# Patient Record
Sex: Female | Born: 1990 | Race: Black or African American | Hispanic: No | Marital: Single | Smoking: Never smoker
Health system: Southern US, Community
[De-identification: ages and names within clinical notes are randomized; demographics above are authoritative.]

## PROBLEM LIST (undated history)

## (undated) ENCOUNTER — Inpatient Hospital Stay (HOSPITAL_COMMUNITY): Payer: Self-pay

---
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
## 2011-01-13 NOTE — Miscellaneous (Signed)
   Clinical Lists Changes  Problems: Changed problem from ASTHMA, UNSPECIFIED (ICD-493.90) to ASTHMA, INTERMITTENT (ICD-493.90) 

## 2011-01-13 NOTE — Miscellaneous (Signed)
Summary: immunizations   Clinical Lists Changes all immunizations from paper chart entered in [facility]. Theresia Lo RN  September 03, 2010 3:38 PM

## 2011-07-15 ENCOUNTER — Emergency Department (HOSPITAL_COMMUNITY)
Admission: EM | Admit: 2011-07-15 | Discharge: 2011-07-16 | Disposition: A | Discharge status: Home / Self Care | Payer: 59 | Attending: Emergency Medicine | Admitting: Emergency Medicine

## 2011-07-15 DIAGNOSIS — S61209A Unspecified open wound of unspecified finger without damage to nail, initial encounter: Secondary | ICD-10-CM | POA: Insufficient documentation

## 2011-07-15 DIAGNOSIS — Y9289 Other specified places as the place of occurrence of the external cause: Secondary | ICD-10-CM | POA: Insufficient documentation

## 2011-07-15 DIAGNOSIS — T148 Other injury of unspecified body region: Secondary | ICD-10-CM | POA: Insufficient documentation

## 2011-07-15 DIAGNOSIS — X58XXXA Exposure to other specified factors, initial encounter: Secondary | ICD-10-CM | POA: Insufficient documentation

## 2011-07-15 DIAGNOSIS — W57XXXA Bitten or stung by nonvenomous insect and other nonvenomous arthropods, initial encounter: Secondary | ICD-10-CM | POA: Insufficient documentation

## 2012-04-06 ENCOUNTER — Ambulatory Visit (INDEPENDENT_AMBULATORY_CARE_PROVIDER_SITE_OTHER): Payer: 59 | Admitting: Family Medicine

## 2012-04-06 ENCOUNTER — Encounter: Payer: Self-pay | Admitting: Family Medicine

## 2012-04-06 VITALS — BP 98/62 | HR 79 | Temp 98.3°F | Ht 61.25 in | Wt 131.0 lb

## 2012-04-06 DIAGNOSIS — J45909 Unspecified asthma, uncomplicated: Secondary | ICD-10-CM

## 2012-04-06 DIAGNOSIS — E785 Hyperlipidemia, unspecified: Secondary | ICD-10-CM | POA: Insufficient documentation

## 2012-04-06 DIAGNOSIS — Z0000 Encounter for general adult medical examination without abnormal findings: Secondary | ICD-10-CM

## 2012-04-06 NOTE — Patient Instructions (Signed)
Donna Underwood,  It was very nice to meet you. Thank you for coming in to see me today.   I will call or send a letter with the results of your blood work. Please plan to see me when your are 22 for your next physical or sooner if needed.  Dr. Armen Pickup

## 2012-04-07 LAB — LIPID PANEL
HDL: 55 mg/dL (ref >39)
LDL Cholesterol: 115 mg/dL — ABNORMAL HIGH (ref 0–99)
Total CHOL/HDL Ratio: 3.5 Ratio
Triglycerides: 106 mg/dL (ref <150)
VLDL: 21 mg/dL (ref 0–40)

## 2012-04-11 ENCOUNTER — Encounter: Payer: Self-pay | Admitting: Family Medicine

## 2012-04-12 DIAGNOSIS — Z0000 Encounter for general adult medical examination without abnormal findings: Secondary | ICD-10-CM | POA: Insufficient documentation

## 2012-04-12 NOTE — Assessment & Plan Note (Signed)
Well controlled. Not requiring treatment.

## 2012-04-12 NOTE — Assessment & Plan Note (Signed)
Well woman exam. Normal screening lipids and A1c. F/u in 1-2 years. Fist pap at age 21.

## 2012-04-12 NOTE — Progress Notes (Signed)
Patient ID: Australia Droll, female   DOB: Apr 21, 1991, 21 y.o.   MRN: [id number] Subjective:     Zionna Homewood is a 21 y.o. female and is here for a comprehensive physical exam. The patient reports no problems. She is interested in being screened for hyperlipidemia and diabetes given her family history and her personal history of elevated LDL in the past.  Has history of intermittent asthma with exercise, not taking medication.   History   Social History  . Marital Status: Single    Spouse Name: N/A    Number of Children: N/A  . Years of Education: 12    Occupational History  . Clinical cytogeneticist [employer]   Social History Main Topics  . Smoking status: Never Smoker   . Smokeless tobacco: Never Used  . Alcohol Use: 0.5 oz/week    1 drink(s) per week     drank once.   . Drug Use: Not on file  . Sexually Active: No   Other Topics Concern  . Not on file   Social History Narrative   Lives with mom and sister.    Health Maintenance  Topic Date Due  . Pap Smear  07/17/2010  . Tetanus/tdap  07/18/2011  . Influenza Vaccine  09/12/2012   Family History  Problem Relation Age of Onset  . Hypertension Mother   . Eczema Sister    Review of Systems A comprehensive review of systems was negative.   Objective:    BP 98/62  Pulse 79  Temp(Src) 98.3 F (36.8 C) (Oral)  Ht 5' 1.25" (1.556 m)  Wt 131 lb (59.421 kg)  BMI 24.55 kg/m2  LMP 04/06/2012 General appearance: alert, cooperative and no distress Head: Normocephalic, without obvious abnormality, atraumatic Eyes: conjunctivae/corneas clear. PERRL, EOM's intact.  Ears: abnormal external canal right ear - cerumen removed by irrigation and normal TM noted after wax removal. Normal L external ear canal and TM.  Nose: Nares normal. Septum midline. Mucosa normal. No drainage or sinus tenderness. Throat: lips, mucosa, and tongue normal; teeth and gums normal Neck: no adenopathy, no carotid bruit, no JVD, supple, symmetrical,  trachea midline and thyroid not enlarged, symmetric, no tenderness/mass/nodules Lungs: clear to auscultation bilaterally Heart: regular rate and rhythm, S1, S2 normal, no murmur, click, rub or gallop Abdomen: soft, non-tender; bowel sounds normal; no masses,  no organomegaly Extremities: extremities normal, atraumatic, no cyanosis or edema Skin: Skin color, texture, turgor normal. No rashes or lesions Neurologic: Grossly normal    Assessment:    Healthy female exam. Normal A1c and LDL at goal. F/u in 1-2 years or sooner as needed. Fist pap smear due at age 64.      Plan:     See After Visit Summary for Counseling Recommendations

## 2012-04-12 NOTE — Assessment & Plan Note (Signed)
Elevated LDL but well within goal of < 140.

## 2013-02-11 ENCOUNTER — Encounter (HOSPITAL_COMMUNITY): Payer: Self-pay | Admitting: *Deleted

## 2013-02-11 ENCOUNTER — Emergency Department (INDEPENDENT_AMBULATORY_CARE_PROVIDER_SITE_OTHER)
Admission: EM | Admit: 2013-02-11 | Discharge: 2013-02-11 | Disposition: A | Discharge status: Home / Self Care | Payer: 59 | Source: Home / Self Care | Attending: Family Medicine | Admitting: Family Medicine

## 2013-02-11 DIAGNOSIS — S39012A Strain of muscle, fascia and tendon of lower back, initial encounter: Secondary | ICD-10-CM

## 2013-02-11 DIAGNOSIS — S335XXA Sprain of ligaments of lumbar spine, initial encounter: Secondary | ICD-10-CM

## 2013-02-11 LAB — POCT URINALYSIS DIP (DEVICE)
Ketones, ur: NEGATIVE mg/dL
Protein, ur: 30 mg/dL — AB
Specific Gravity, Urine: 1.02 (ref 1.005–1.030)
Urobilinogen, UA: 0.2 mg/dL (ref 0.0–1.0)
pH: 7 (ref 5.0–8.0)

## 2013-02-11 MED ORDER — DICLOFENAC POTASSIUM 50 MG PO TABS: 50.0000 mg | ORAL_TABLET | Freq: Three times a day (TID) | ORAL | Status: DC

## 2013-02-11 NOTE — ED Provider Notes (Signed)
History     CSN: [id number]  Arrival date & time 02/11/13  1449   First MD Initiated Contact with Patient 02/11/13 1454      Chief Complaint  Patient presents with  . Back Pain    (Consider location/radiation/quality/duration/timing/severity/associated sxs/prior treatment) Patient is a 22 y.o. female presenting with back pain. The history is provided by the patient.  Back Pain Location:  Lumbar spine Quality:  Shooting Radiates to:  Does not radiate Pain severity:  Mild Onset quality:  Gradual Duration:  1 week Timing:  Intermittent Progression:  Waxing and waning Chronicity:  New Context: physical stress   Context comment:  Started new job and sits a lot. Relieved by:  NSAIDs Worsened by:  Sitting Associated symptoms: no abdominal pain, no bladder incontinence, no fever and no pelvic pain     Past Medical History  Diagnosis Date  . Asthma     since childhood, exercise induced now    Past Surgical History  Procedure Laterality Date  . Myringotomy  2002    Family History  Problem Relation Age of Onset  . Hypertension Mother   . Eczema Sister     History  Substance Use Topics  . Smoking status: Never Smoker   . Smokeless tobacco: Never Used  . Alcohol Use: No     Comment: drank once.     OB History   Grav Para Term Preterm Abortions TAB SAB Ect Mult Living                  Review of Systems  Constitutional: Negative.  Negative for fever.  HENT: Negative.   Cardiovascular: Negative.   Gastrointestinal: Negative.  Negative for abdominal pain.  Genitourinary: Negative for bladder incontinence and pelvic pain.  Musculoskeletal: Positive for back pain. Negative for joint swelling and gait problem.  Skin: Negative.     Allergies  Ceclor  Home Medications   Current Outpatient Rx  Name  Route  Sig  Dispense  Refill  . diclofenac (CATAFLAM) 50 MG tablet   Oral   Take 1 tablet (50 mg total) by mouth 3 (three) times daily. For back pain as  needed.   21 tablet   0   . zanamivir (RELENZA DISKHALER) inhaler   Inhalation   Inhale 10 mg into the lungs every 12 (twelve) hours. X 5 days            BP 117/71  Pulse 70  Temp(Src) 98.4 F (36.9 C) (Oral)  Resp 17  SpO2 100%  LMP 02/11/2013  Physical Exam  Nursing note and vitals reviewed. Constitutional: She is oriented to person, place, and time. She appears well-developed and well-nourished. No distress.  HENT:  Head: Normocephalic.  Neck: Normal range of motion. Neck supple.  Pulmonary/Chest: Breath sounds normal.  Abdominal: Soft. Bowel sounds are normal.  Musculoskeletal: She exhibits tenderness.       Lumbar back: She exhibits tenderness. She exhibits normal range of motion, no bony tenderness, no deformity, no spasm and normal pulse.  Lymphadenopathy:    She has no cervical adenopathy.  Neurological: She is alert and oriented to person, place, and time.  Skin: Skin is warm and dry.    ED Course  Procedures (including critical care time)  Labs Reviewed  POCT URINALYSIS DIP (DEVICE) - Abnormal; Notable for the following:    Hgb urine dipstick MODERATE (*)    Protein, ur 30 (*)    Leukocytes, UA TRACE (*)    All other components  within normal limits   No results found.   1. Low back strain, initial encounter       MDM  U/a abnl but pt currently on menses.        Linna Hoff, MD 02/11/13 [phone]

## 2013-02-11 NOTE — ED Notes (Signed)
C/o low back pain onset 1 week.  Pain is a burning pain.  No known injury.  Sits at computer at work. Pain is constant- no position gives her any relief.

## 2013-02-11 NOTE — ED Notes (Signed)
Urinates a lot but states she drinks a lot of water.  Urine appeared dark amber.

## 2013-04-30 ENCOUNTER — Emergency Department (INDEPENDENT_AMBULATORY_CARE_PROVIDER_SITE_OTHER)
Admission: EM | Admit: 2013-04-30 | Discharge: 2013-04-30 | Disposition: A | Discharge status: Home / Self Care | Payer: 59 | Source: Home / Self Care

## 2013-04-30 ENCOUNTER — Encounter (HOSPITAL_COMMUNITY): Payer: Self-pay | Admitting: Emergency Medicine

## 2013-04-30 DIAGNOSIS — N949 Unspecified condition associated with female genital organs and menstrual cycle: Secondary | ICD-10-CM

## 2013-04-30 DIAGNOSIS — N920 Excessive and frequent menstruation with regular cycle: Secondary | ICD-10-CM

## 2013-04-30 DIAGNOSIS — N938 Other specified abnormal uterine and vaginal bleeding: Secondary | ICD-10-CM

## 2013-04-30 DIAGNOSIS — N925 Other specified irregular menstruation: Secondary | ICD-10-CM

## 2013-04-30 LAB — POCT I-STAT, CHEM 8
BUN: 9 mg/dL (ref 6–23)
Calcium, Ion: 1.21 mmol/L (ref 1.12–1.23)
Chloride: 105 mEq/L (ref 96–112)
Creatinine, Ser: 0.8 mg/dL (ref 0.50–1.10)
Glucose, Bld: 81 mg/dL (ref 70–99)
Potassium: 4.4 mEq/L (ref 3.5–5.1)

## 2013-04-30 LAB — POCT URINALYSIS DIP (DEVICE)
Ketones, ur: NEGATIVE mg/dL
Protein, ur: 30 mg/dL — AB
Specific Gravity, Urine: 1.02 (ref 1.005–1.030)
Urobilinogen, UA: 0.2 mg/dL (ref 0.0–1.0)
pH: 8 (ref 5.0–8.0)

## 2013-04-30 MED ORDER — ETHYNODIOL DIAC-ETH ESTRADIOL 1-35 MG-MCG PO TABS: 1.0000 | ORAL_TABLET | Freq: Every day | ORAL | Status: DC

## 2013-04-30 NOTE — ED Provider Notes (Signed)
Medical screening examination/treatment/procedure(s) were performed by resident physician or non-physician practitioner and as supervising physician I was immediately available for consultation/collaboration.   Barkley Bruns MD.   Linna Hoff, MD 04/30/13 2010

## 2013-04-30 NOTE — ED Notes (Signed)
Low abdominal pain and low back pain.  Reports vaginal bleeding since April, reports heavy clots since may 1 and pain since Monday.

## 2013-04-30 NOTE — ED Provider Notes (Signed)
History     CSN: [id number]  Arrival date & time 04/30/13  1745   First MD Initiated Contact with Patient 04/30/13 1851      Chief Complaint  Patient presents with  . Abdominal Pain    (Consider location/radiation/quality/duration/timing/severity/associated sxs/prior treatment) HPI Comments: 22 year old female is complaining of vaginal bleeding since 03/08/2013. She states her last normal time period was April 7. On March 27 she started spotting but over the ensuing 6 weeks for the left lower is increased to the point that she is having heavy bleeding and blood clots. Associated with  bleeding is  pelvic cramping intermittent. Nulligravida.   Past Medical History  Diagnosis Date  . Asthma     since childhood, exercise induced now    Past Surgical History  Procedure Laterality Date  . Myringotomy  2002    Family History  Problem Relation Age of Onset  . Hypertension Mother   . Eczema Sister     History  Substance Use Topics  . Smoking status: Never Smoker   . Smokeless tobacco: Never Used  . Alcohol Use: No     Comment: drank once.     OB History   Grav Para Term Preterm Abortions TAB SAB Ect Mult Living                  Review of Systems  Constitutional:       Weakness  HENT: Negative.   Respiratory: Negative.   Cardiovascular: Negative.   Gastrointestinal: Negative for nausea, vomiting and blood in stool.       Yesterday he had a loose stool.  Genitourinary: Positive for vaginal bleeding, menstrual problem and pelvic pain. Negative for dysuria, frequency, vaginal discharge and vaginal pain.  Musculoskeletal: Negative.   Skin: Negative.   Neurological: Negative.     Allergies  Ceclor  Home Medications   Current Outpatient Rx  Name  Route  Sig  Dispense  Refill  . ibuprofen (ADVIL,MOTRIN) 200 MG tablet   Oral   Take 200 mg by mouth every 6 (six) hours as needed for pain.         Marland Kitchen diclofenac (CATAFLAM) 50 MG tablet   Oral   Take 1 tablet  (50 mg total) by mouth 3 (three) times daily. For back pain as needed.   21 tablet   0   . ethynodiol-ethinyl estradiol (KELNOR,ZOVIA) 1-35 MG-MCG tablet   Oral   Take 1 tablet by mouth daily.   1 Package   0   . zanamivir (RELENZA DISKHALER) inhaler   Inhalation   Inhale 10 mg into the lungs every 12 (twelve) hours. X 5 days            BP 126/82  Pulse 81  Temp(Src) 98.3 F (36.8 C) (Oral)  Resp 16  SpO2 100%  LMP 04/30/2013  Physical Exam  Nursing note and vitals reviewed. Constitutional: She appears well-developed and well-nourished. No distress.  Eyes: Conjunctivae and EOM are normal.  Neck: Normal range of motion. Neck supple.  Cardiovascular: Normal rate and normal heart sounds.   Pulmonary/Chest: Effort normal and breath sounds normal. No respiratory distress.  Abdominal: Soft. She exhibits no distension and no mass. There is no tenderness. There is no rebound and no guarding.  Musculoskeletal: She exhibits no edema.  Neurological: She is alert. She exhibits normal muscle tone.  Skin: Skin is warm and dry.  Psychiatric: She has a normal mood and affect.    ED Course  Procedures (including  critical care time)  Labs Reviewed  POCT PREGNANCY, URINE   No results found.  Results for orders placed during the hospital encounter of 04/30/13  POCT PREGNANCY, URINE      Result Value Range   Preg Test, Ur NEGATIVE  NEGATIVE      1. DUB (dysfunctional uterine bleeding)   2. Menorrhagia       MDM  Demulen 1/35 one twice a day tomorrow and then 1 daily. Must followup with your doctor in 7-10 days. For worsening new symptoms or problems may need to see her doctor earlier or [facility]. I stat is within normal limits. H&H is normal.      Is a  Hayden Rasmussen, NP 04/30/13 [phone]

## 2013-08-09 ENCOUNTER — Telehealth: Payer: Self-pay | Admitting: Family Medicine

## 2013-08-09 ENCOUNTER — Other Ambulatory Visit (HOSPITAL_COMMUNITY)
Admission: RE | Admit: 2013-08-09 | Discharge: 2013-08-09 | Disposition: A | Discharge status: Home / Self Care | Payer: 59 | Source: Ambulatory Visit | Attending: Family Medicine | Admitting: Family Medicine

## 2013-08-09 ENCOUNTER — Ambulatory Visit (INDEPENDENT_AMBULATORY_CARE_PROVIDER_SITE_OTHER): Payer: 59 | Admitting: Family Medicine

## 2013-08-09 ENCOUNTER — Encounter: Payer: Self-pay | Admitting: Family Medicine

## 2013-08-09 VITALS — BP 111/66 | HR 102 | Ht 61.25 in | Wt 146.0 lb

## 2013-08-09 DIAGNOSIS — Z01419 Encounter for gynecological examination (general) (routine) without abnormal findings: Secondary | ICD-10-CM | POA: Insufficient documentation

## 2013-08-09 DIAGNOSIS — R350 Frequency of micturition: Secondary | ICD-10-CM

## 2013-08-09 DIAGNOSIS — J45909 Unspecified asthma, uncomplicated: Secondary | ICD-10-CM

## 2013-08-09 DIAGNOSIS — Z124 Encounter for screening for malignant neoplasm of cervix: Secondary | ICD-10-CM

## 2013-08-09 LAB — POCT UA - MICROSCOPIC ONLY

## 2013-08-09 LAB — POCT URINALYSIS DIPSTICK
Bilirubin, UA: NEGATIVE
Ketones, UA: NEGATIVE
Leukocytes, UA: NEGATIVE
Spec Grav, UA: 1.025
pH, UA: 7.5

## 2013-08-09 MED ORDER — LORATADINE 10 MG PO TABS: 10.0000 mg | ORAL_TABLET | Freq: Every day | ORAL | Status: AC

## 2013-08-09 MED ORDER — ALBUTEROL SULFATE HFA 108 (90 BASE) MCG/ACT IN AERS: 2.0000 | INHALATION_SPRAY | Freq: Four times a day (QID) | RESPIRATORY_TRACT | Status: DC | PRN

## 2013-08-09 NOTE — Assessment & Plan Note (Signed)
Assessment : No evidence of urinary tract infection. Blood on the UA is secondary to bleeding from Pap smear. No protein or glucose in urine. Screening nonfasting hemoglobin is normal.  Plan is for reassurance.

## 2013-08-09 NOTE — Telephone Encounter (Signed)
Called patient and let her know that's  labs from today were normal.  She had no questions.

## 2013-08-09 NOTE — Assessment & Plan Note (Signed)
Refill albuterol and start Claritin today.

## 2013-08-09 NOTE — Progress Notes (Signed)
Patient ID: Donna Underwood, female   DOB: 1991/07/08, 22 y.o.   MRN: [id number] Subjective:     Donna Underwood is a 22 y.o. female and is here for a comprehensive physical exam. The patient reports no problems.  Asthma: Patient does admit to shortness of breath when exercising. She has not had her albuterol inhaler to use. She denies cough or wheezing.  Urinary frequency: Patient admits to urinary frequency without dysuria for the past 3 months. She can uses to drink a lot of water. She has gained weight. She does have a family history of diabetes. She is not actually active. History   Social History  . Marital Status: Single    Spouse Name: N/A    Number of Children: N/A  . Years of Education: 12    Occupational History  .  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 Smoking status: Never Smoker   . Smokeless tobacco: Never Used  . Alcohol Use: No     Comment: drank once.   . Drug Use: No  . Sexual Activity: No   Other Topics Concern  . Not on file   Social History Narrative   Lives with mom and sister.          Health Maintenance  Topic Date Due  . Pap Smear  07/17/2010  . Tetanus/tdap  07/18/2011  . Influenza Vaccine  07/13/2013   Review of Systems Patient Information Form: Screening and ROS  AUDIT-C Score: 1 Do you feel safe in relationships? yes PHQ-2:positive, feelling down depressed or hopeless.   Review of Symptoms  General:  Negative for unexplained weight loss, fever Skin: Negative for new or changing mole, sore that won't heal HEENT: Negative for trouble hearing, trouble seeing, ringing in ears, mouth sores, hoarseness, change in voice, dysphagia. CV:  Negative for chest pain, dyspnea, edema, palpitations Resp: positive for trouble breathing. Negative for cough, hemoptysis GI: Positive for diarrhea, abdominal pain and constipation after eating diary food.s Negative for  nausea, vomiting, melena, hematochezia. GU: Negative for dysuria, incontinence, urinary hesitance, hematuria, vaginal or penile discharge, polyuria, sexual difficulty, lumps in testicle or breasts MSK: Positive for R ankle swelling and pain intermittent. Negative for muscle cramps or aches,  Neuro: Negative for headaches, weakness, numbness, dizziness, passing out/fainting Psych: Positive for stress. Negative for depression, anxiety, memory problems Endo: Positive for frequent urination. Negative for frequent thirst.   Objective:    BP 111/66  Pulse 102  Ht 5' 1.25" (1.556 m)  Wt 146 lb (66.225 kg)  BMI 27.35 kg/m2  LMP 07/03/2013 General appearance: alert, cooperative and no distress Head: Normocephalic, without obvious abnormality, atraumatic Eyes: conjunctivae/corneas clear. PERRL, EOM's intact. Fundi benign. Ears: normal TM's and external ear canals both ears Nose: no discharge, right turbinate swollen, left turbinate normal, no sinus tenderness Throat: lips, mucosa, and tongue normal; teeth and gums normal Back: symmetric, no curvature. ROM normal. No CVA tenderness. Lungs: clear to auscultation bilaterally Heart: regular rate and rhythm, S1, S2 normal, no murmur, click, rub or gallop Abdomen: soft, non-tender; bowel sounds normal; no masses,  no organomegaly Pelvic: cervix normal in appearance, external genitalia normal, no adnexal masses or tenderness, no cervical motion tenderness, positive findings: vaginal discharge:  normal and physiologic and uterus normal size, shape, and consistency Extremities: extremities normal, atraumatic, no cyanosis or edema Pulses: 2+ and symmetric Skin: Skin color, texture, turgor  normal. No rashes or lesions Neurologic: Alert and oriented X 3, normal strength and tone. Normal symmetric reflexes. Normal coordination and gait    Assessment:    Healthy female exam.  Pap smear done today.    Plan:     See After Visit Summary for Counseling  Recommendations

## 2013-08-09 NOTE — Patient Instructions (Addendum)
Donna Underwood,  Thank you for coming in today. It was great to see you!   Your exam is normal. I have ordered an albuterol inhaler and Claritin to help with your shortness of breath. We did a urinalysis and blood sugar to screen for diabetes.   I will call with pap smear results.   All the best with your studies. Remember to plan your work so you do not get overwhelmed. Take time for yourself every month. Exercise regularly.   Dr. Armen Pickup

## 2013-08-16 ENCOUNTER — Encounter: Payer: Self-pay | Admitting: Family Medicine

## 2013-10-18 ENCOUNTER — Other Ambulatory Visit: Payer: Self-pay

## 2014-01-23 ENCOUNTER — Ambulatory Visit (INDEPENDENT_AMBULATORY_CARE_PROVIDER_SITE_OTHER): Payer: 59 | Admitting: Family Medicine

## 2014-01-23 ENCOUNTER — Ambulatory Visit: Payer: 59 | Admitting: Family Medicine

## 2014-01-23 ENCOUNTER — Encounter: Payer: Self-pay | Admitting: Family Medicine

## 2014-01-23 ENCOUNTER — Ambulatory Visit
Admission: RE | Admit: 2014-01-23 | Discharge: 2014-01-23 | Disposition: A | Discharge status: Home / Self Care | Payer: 59 | Source: Ambulatory Visit | Attending: Family Medicine | Admitting: Family Medicine

## 2014-01-23 VITALS — BP 112/60 | HR 72 | Temp 98.6°F | Wt 152.0 lb

## 2014-01-23 DIAGNOSIS — M25579 Pain in unspecified ankle and joints of unspecified foot: Secondary | ICD-10-CM

## 2014-01-23 DIAGNOSIS — J069 Acute upper respiratory infection, unspecified: Secondary | ICD-10-CM

## 2014-01-23 MED ORDER — IPRATROPIUM BROMIDE 0.06 % NA SOLN: 2.0000 | Freq: Four times a day (QID) | NASAL | Status: DC

## 2014-01-23 NOTE — Patient Instructions (Addendum)
Thank you for coming in today The cause of your ankle pain and swelling is unclear There may be an underlying bony abnormality that I cannot appreciate in our office today, so please go get an xray performed Please start taking 600mg  of motrin every 6 hours for the next 3-5 days then as needed for the pain Consider using an ACE wrap and different insoles with more arch support Please come back in 4 weeks if you are no better Please start the atrovent nasal spray for yoru congestion, please also consider using Mucinex-D and motrin for yoru symptoms Your cold should resolve in another 5-7 days.

## 2014-01-23 NOTE — Assessment & Plan Note (Addendum)
Etiology unclear Some pes planus bilat but no other abnormality noted on exam Likely some component of underlying inflammation/tendonopathy but intermittent symptoms is unusual Pt w/ motrin at home. Start motrin 600mg  every 6 hours for the next 3-5 days then prn.  Xray to r/o any underlying bony abnormality though will not show soft tissue or tendon/ligament changes Pt to purchase insoles w/ improved arch support Also consider ACE wrap at home as symptoms improve w/ pressure

## 2014-01-23 NOTE — Progress Notes (Signed)
Donna Underwood is a 23 y.o. female who [facility] to [facility] today for Ankle swelling  Ankle Swelling : L side. Painful. Off and on. Present for 6 wks. No change in activity prior to onset. Pain is so bad that pt limps. Denies trauma. Sensation, movement, and strength intact. Pain/swelling episodes last about 1 day. Pain occurs about 2x wkly.   Cough, runny nose and subjective fever and chills, and congtestion.  Present for 24 hours. Lots of sick contacts    The following portions of the patient's history were reviewed and updated as appropriate: allergies, current medications, past medical history, family and social history, and problem list.  Patient is a nonsmoker  Past Medical History  Diagnosis Date  . Asthma     since childhood, exercise induced now    ROS as above otherwise neg.    Medications reviewed. Current Outpatient Prescriptions  Medication Sig Dispense Refill  . albuterol (PROVENTIL HFA;VENTOLIN HFA) 108 (90 BASE) MCG/ACT inhaler Inhale 2 puffs into the lungs every 6 (six) hours as needed for wheezing.  1 Inhaler  1  . ipratropium (ATROVENT) 0.06 % nasal spray Place 2 sprays into both nostrils 4 (four) times daily.  15 mL  12  . loratadine (CLARITIN) 10 MG tablet Take 1 tablet (10 mg total) by mouth daily.  30 tablet  11   No current facility-administered medications for this visit.    Exam: BP 112/60  Pulse 72  Temp(Src) 98.6 F (37 C) (Oral)  Wt 152 lb (68.947 kg)  LMP 01/23/2014 Gen: Well NAD HEENT: EOMI,  MMM, nasal speech and congestino. maxiallary and frontal sinus ttp MSK: FROM ankles bilat. Non-ttp, L ankle w/o laxity from ant/post/lat/med stresses. Partial arch collapse w/ standing and ambulation.   No results found for this or any previous visit (from the past 72 hour(s)).  A/P (as seen in Problem list)  Pain in joint, ankle and foot Etiology unclear Some pes planus bilat but no other abnormality noted on exam Likely some component of underlying  inflammation/tendonopathy but intermittent symptoms is unusual Pt w/ motrin at home. Start motrin 600mg  every 6 hours for the next 3-5 days then prn.  Xray to r/o any underlying bony abnormality though will not show soft tissue or tendon/ligament changes Pt to purchase insoles w/ improved arch support Also consider ACE wrap at home as symptoms improve w/ pressure  Viral URI Likely viral uri Intranasal atrovent, mucinex D, Motrin as below, Precautions given

## 2014-01-23 NOTE — Assessment & Plan Note (Signed)
Likely viral uri Intranasal atrovent, mucinex D, Motrin as below, Precautions given

## 2014-01-24 ENCOUNTER — Telehealth: Payer: Self-pay | Admitting: Family Medicine

## 2014-01-24 NOTE — Telephone Encounter (Signed)
Called pt to inform of results from Xray and need for cam walker boot. Pt did not answer so left VM Pt needs to come in on Friday to pick up prescription and [facility]  Shelly Flattenavid Emmette Katt, MD Family Medicine PGY-3 01/24/2014, 4:22 PM

## 2014-01-25 NOTE — Telephone Encounter (Signed)
Rx picked up by pt.  Pt to return in 2 wks   Shelly Flattenavid Leigh Kaeding, MD Family Medicine PGY-3 01/25/2014, 8:59 AM

## 2014-01-30 ENCOUNTER — Encounter: Payer: Self-pay | Admitting: *Deleted

## 2014-01-30 ENCOUNTER — Telehealth: Payer: Self-pay | Admitting: Family Medicine

## 2014-01-30 NOTE — Telephone Encounter (Signed)
Pt informed and appt made. Donna Underwood [name]  

## 2014-01-30 NOTE — Telephone Encounter (Signed)
Needs to wear brace until f/u w/ me in clinic Please call pt

## 2014-01-30 NOTE — Telephone Encounter (Signed)
Pt wants to know how long she has to wear her boot this week

## 2014-02-06 ENCOUNTER — Encounter: Payer: Self-pay | Admitting: Family Medicine

## 2014-02-06 ENCOUNTER — Ambulatory Visit (INDEPENDENT_AMBULATORY_CARE_PROVIDER_SITE_OTHER): Payer: 59 | Admitting: Family Medicine

## 2014-02-06 VITALS — BP 123/84 | HR 99 | Temp 99.0°F | Ht 61.25 in | Wt 156.0 lb

## 2014-02-06 DIAGNOSIS — Z23 Encounter for immunization: Secondary | ICD-10-CM

## 2014-02-06 DIAGNOSIS — M25579 Pain in unspecified ankle and joints of unspecified foot: Secondary | ICD-10-CM

## 2014-02-06 NOTE — Patient Instructions (Signed)
You are healing very well from stress fracture Please continue to use the boot for long periods of being on your feet for another 2 weeks Please start your daily exercises In 2 weeks you may return to lite physical activity without the boot but make sure you take it slow at first.  Use ice after your exercises Please come back if you develop any further pain  Stress Fracture A break in a bone that is caused by repetitive and/or intense exercise and prolonged pressure on the bone is known as a stress fracture. Stress fractures may pass through the entire bone (complete) or partially break (incomplete). SYMPTOMS   Inflammation at the fracture site.  Bleeding (uncommon).  Bruising (uncommon).  Pain and tenderness over the fracture site.  Weakness and inability to bear weight on the injured extremity.  Paleness and deformity (sometimes). CAUSES  Stress fractures are the result of repetitive micro trauma of a bone. The bone is injured at a greater rate than it can be healed by the body. This causes the bone to become weak and susceptible to fracture. Stress fractures typically follow changes in:  Training or performance schedule.  Equipment.  Intensity of activity. Women who no longer have their periods are more prone to stress fractures.  RISK INCREASES WITH:  Previous stress fracture.  Certain sports associated with specific fractures:  Leg: running, soccer, swimming, ballet, basketball.  Foot: running, walking, marching, swimming, soccer, ballet.  Heel bone: basketball, volleyball.  Thigh: running, basketball, jumping.  Kneecap (patella): basketball, catching in baseball.  Hand: tennis, handball.  Forearm: tennis, javelin.  Arm: baseball, cricket.  Ribs: tennis, baseball, golf, rowing.  Spine: gymnastics, football, cricket, water-skiing.  Bone abnormalities (osteoporosis or bone tumors).  Metabolic disorders or hormone problems.  Nutritional deficiencies and  eating disorders.  Loss of or irregular menstrual periods.  Poor strength and flexibility.  Training on hard surfaces or worn out equipment (running with shoes with more than 600 miles of wear), hard arch supports made from metal or hard plastic (orthotics). PREVENTION  Warm up and stretch properly before activity.  Maintain physical fitness:  Muscle strength and endurance.  Flexibility.  Cardiovascular fitness.  Learn and use proper technique with training and activity.  Gradually increase activity and training.  Wear properly fitted and padded protective equipment, including proper footwear; change shoes after 300 to 500 miles of running.  Women with menstrual period irregularity can take birth control pills to regulate periods and thus hormonal levels.  Runners with flat feet should wear cushioned arch supports. PROGNOSIS  Stress fractures are typically curable if treated properly. RELATED COMPLICATIONS   Failure to heal (nonunion).  Healing in a poor position (malunion).  Recurrence of stress fracture.  Stress fracture progressing to a complete and displaced fracture.  Risks of surgery, including infection, bleeding, injury to nerves (numbness, weakness, paralysis), need for further surgery, and bone death.  Recurrence of stress fractures, not necessarily in the same bone or location (occurs in 1 in 10 patients). TREATMENT  Treatment initially involves ice and medicine to help reduce pain and inflammation, as well as rest from any activity that causes symptoms to become more severe. Your caregiver may recommend that you immobilize the injured bone and associated joint(s) to allow for healing. Bone growth stimulators may be used to increase the rate of recovery. Occasionally surgery is necessary, especially if:  The fracture is at a high risk of moving and great risk of complications (hip).  The fracture is at  a high risk of not healing (Jones fracture, certain leg  fractures).  The fracture becomes complete and out of alignment (displaced). Immobility of a bone for a long period can cause loss of muscle bulk, stiffness in nearby joints, and accumulation of fluid in tissues (edema). Strengthening and stretch exercises may be necessary after immobilization to regain strength and a full range of motion. MEDICATION   If pain medication is necessary, then nonsteroidal anti-inflammatory medications, such as aspirin and ibuprofen, or other minor pain relievers, such as acetaminophen, are often recommended.  Do not take pain medication for 7 days before surgery.  Prescription pain relievers may be prescribed. Use only as directed and only as much as you need.  Ointments applied to the skin may be helpful. SEEK MEDICAL CARE IF:   Symptoms get worse or do not improve in 2 weeks despite treatment.  Any of the following occur after immobilization or surgery (report any of these signs immediately):  Swelling above or below the fracture site.  Severe, persistent pain.  Blue or gray skin below the fracture site, especially under the nails, or numbness or loss of feeling below the fracture site.  New, unexplained symptoms develop (drugs used in treatment may produce side effects). Document Released: 11/29/2005 Document Revised: 02/21/2012 Document Reviewed: 03/13/2009 [facility] Patient Information 2014 [place], [place].

## 2014-02-06 NOTE — Assessment & Plan Note (Signed)
Well healing stress fracture.  Greater than 4 wks out now Continue cam walker boot for 2 more wks Start Exercises  Return to lite activity in 2 wks Rehab program discussed

## 2014-02-06 NOTE — Progress Notes (Signed)
Donna Underwood is a 23 y.o. female [facility]  Stress Fracture: pain has resolved. Wearing cam walker boot daily. No longer taking motrin. No further swelling. Pt endorses that symptoms started after starting new exercise routine including running about 8 wks ago.    The following portions of the patient's history were reviewed and updated as appropriate: allergies, current medications, past medical history, family and social history, and problem list.  Patient is a nonsmoker.  Past Medical History  Diagnosis Date  . Asthma     since childhood, exercise induced now    ROS as above otherwise neg.    Medications reviewed. Current Outpatient Prescriptions  Medication Sig Dispense Refill  . albuterol (PROVENTIL HFA;VENTOLIN HFA) 108 (90 BASE) MCG/ACT inhaler Inhale 2 puffs into the lungs every 6 (six) hours as needed for wheezing.  1 Inhaler  1  . ipratropium (ATROVENT) 0.06 % nasal spray Place 2 sprays into both nostrils 4 (four) times daily.  15 mL  12  . loratadine (CLARITIN) 10 MG tablet Take 1 tablet (10 mg total) by mouth daily.  30 tablet  11   No current facility-administered medications for this visit.    Exam: BP 123/84  Pulse 99  Temp(Src) 99 F (37.2 C) (Oral)  Ht 5' 1.25" (1.556 m)  Wt 156 lb (70.761 kg)  BMI 29.23 kg/m2  LMP 01/23/2014 Gen: Well NAD HEENT: EOMI,  MMM MSK: L ankle FROM. Non-ttp. Single leg stand w/o pain. Heel raise w/o pain or difficulty. Sensation intact.   No results found for this or any previous visit (from the past 72 hour(s)).  A/P (as seen in Problem list)  Pain in joint, ankle and foot Well healing stress fracture.  Greater than 4 wks out now Continue cam walker boot for 2 more wks Start Exercises  Return to lite activity in 2 wks Rehab program discussed

## 2014-04-04 ENCOUNTER — Ambulatory Visit (INDEPENDENT_AMBULATORY_CARE_PROVIDER_SITE_OTHER): Payer: 59 | Admitting: Family Medicine

## 2014-04-04 ENCOUNTER — Encounter: Payer: Self-pay | Admitting: Family Medicine

## 2014-04-04 VITALS — BP 120/81 | HR 81 | Ht 61.25 in | Wt 153.8 lb

## 2014-04-04 DIAGNOSIS — N920 Excessive and frequent menstruation with regular cycle: Secondary | ICD-10-CM

## 2014-04-04 LAB — POCT URINE PREGNANCY: Preg Test, Ur: NEGATIVE

## 2014-04-04 MED ORDER — MEDROXYPROGESTERONE ACETATE 10 MG PO TABS: 10.0000 mg | ORAL_TABLET | Freq: Every day | ORAL | Status: DC

## 2014-04-04 NOTE — Assessment & Plan Note (Signed)
Heavy and prolonged menstrual bleeding. Patient is not complaining of symptomatic anemia. This is the second episode for her in the last year, previously had normal and regular cycles. Exam normal with the exception of possible cystlike mass inside vaginal canal that was not visualized by speculum exam. She does not have any evidence of hyperandrogenism, insulin resistance; history ROS only significant for some vague complaints that could be suggestive of hyperthyroid. P: Urine preg test negative. CBC and TSH today. Provera 10mg  daily x 5 days. [facility].

## 2014-04-04 NOTE — Progress Notes (Signed)
Patient ID: Donna Underwood, female   DOB: 09/21/1991, 23 y.o.   MRN: [id number]   Subjective:    Patient ID: Donna Underwood, female    DOB: 07/13/1991, 23 y.o.   MRN: [id number]  HPI  CC: menstrual period for 2 months, passing clots  # Heavy and prolonged menstrual bleeding:  Started bleeding in beginning of March like a normal period, has been continuous since that time ("flow" like a regular period daily)  Started passing larger clots over past 2 weeks (larger than half dollar size)  Menstrual history: menarche at age 89, regular cycle (30 days, 5 days bleeding) up until last year when she also had prolonged bleeding for 1-2 months (went to urgent care because she started cramping and was prescribed contraceptives to stop, but decided to wait a week and it stopped on its own). She had normal periods for 2-3 months after this, but since having her pap smear in August 2014 she has had irregular periods (says about every 2 months, but that she stopped keeping track)  Denies any associated pain  She has not tried anything to make the bleeding stop  Is not on birth control, is not sexually active  FH: says her mom has a history of fibroids ROS: no changes in weight, +fatigue, +feeling hot ("comes and goes"), +more hungry, no hair loss, no tremor, no CP, no palpitations, no SOB, no abdominal pain or cramping  Review of Systems   See HPI for ROS. Objective:  BP 120/81  Pulse 81  Ht 5' 1.25" (1.556 m)  Wt 153 lb 12.8 oz (69.763 kg)  BMI 28.81 kg/m2  LMP 03/01/2014  General: NAD, pleasant HEENT: PERRL, EOMI.  Neck: supple, no thyromegaly or thyroid nodules Cardiac: RRR, normal heart sounds, no murmurs. 2+ radial and PT pulses bilaterally Respiratory: CTAB, normal effort Abdomen: soft, nontender to palpation x 4 quadrants, nondistended, no hepatic or splenomegaly. Bowel sounds present Extremities: no edema or cyanosis. WWP. Skin: warm and dry, no rashes noted. No acanthosis of  neck. GU: normal external genitalia with blood present/oozing. No visible masses or lesions. Vaginal mucosa is normal in appearance, vaginal canal with blood. Cervix appears nulliparous os, smooth, ooze of blood around os. Bimanual exam: normal size ovaries bilaterally, cervix midline, uterus normal sized. Possible small cystlike mass on right side of vaginal canal not seen during speculum exam.     Assessment & Plan:  See Problem List Documentation

## 2014-04-04 NOTE — Patient Instructions (Signed)
We are checking your blood and thyroid study today.  Start taking the Provera, 1 tablet (10mg ) a day for 5 days. If bleeding has stopped, stop taking the provera. It is not uncommon to again have some bleeding after stopping this medication. If you are still bleeding at day 5, continue taking for another 5 days.  [facility].  Schedule a follow up visit with our Women's health clinic in the next 2 weeks (this clinic is held at our office here).

## 2014-04-05 LAB — CBC

## 2014-04-05 LAB — TSH: TSH: 1.055 u[IU]/mL (ref 0.350–4.500)

## 2014-04-06 IMAGING — CR DG ANKLE COMPLETE 3+V*L*
3 series · 3 of 3 positions shown · non-contrast
Comparison: None.

CLINICAL DATA: Left ankle pain/swelling, no trauma

EXAM:
LEFT ANKLE COMPLETE - 3+ VIEW

[view not recorded (1 of 3)]
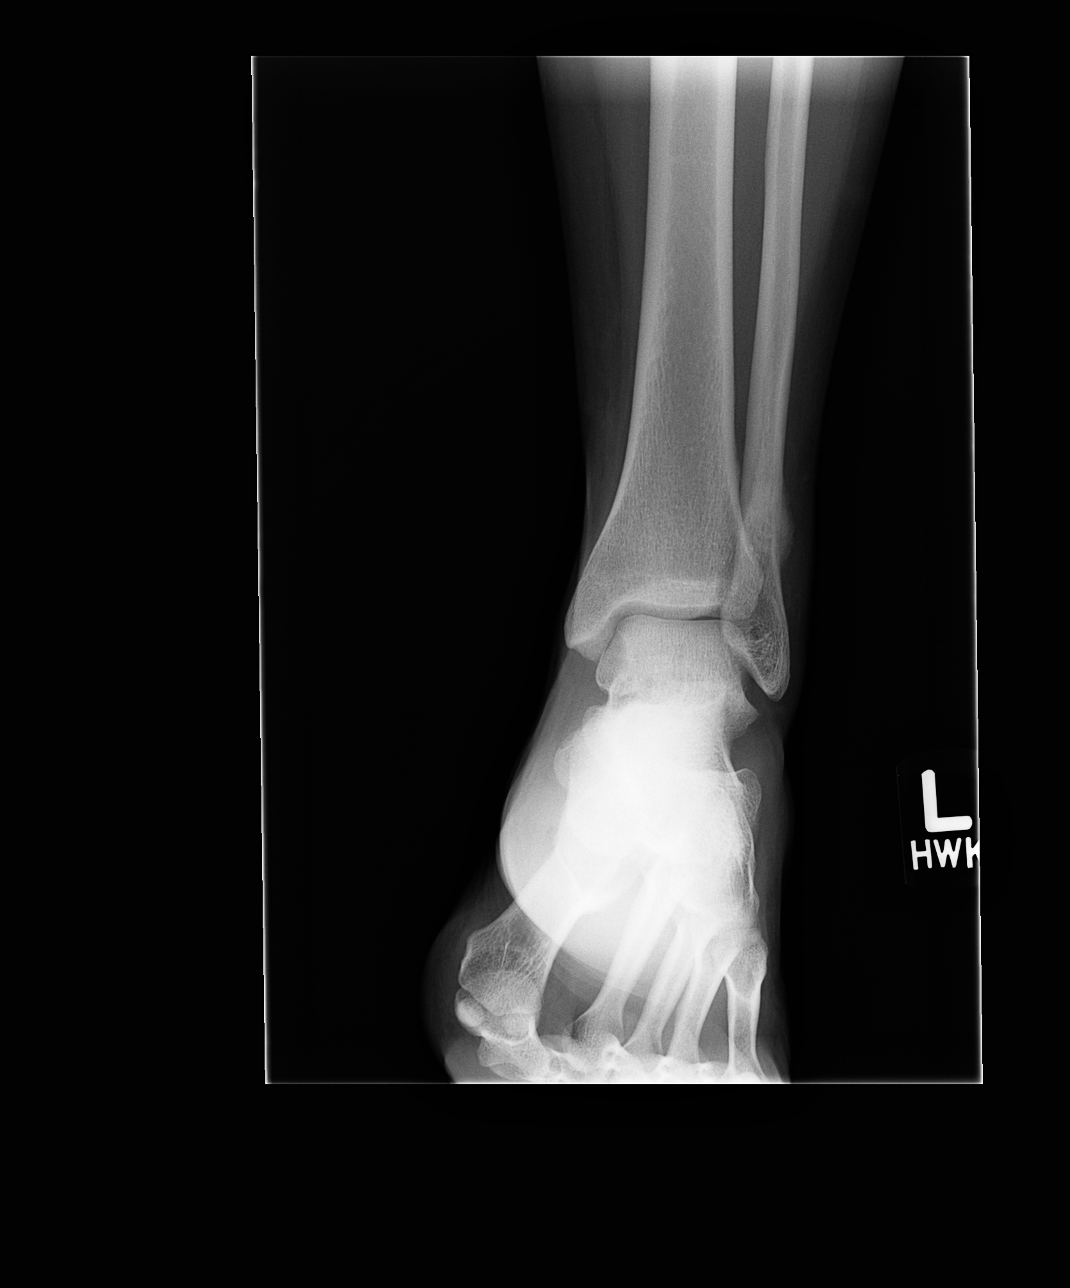

[view not recorded (2 of 3)]
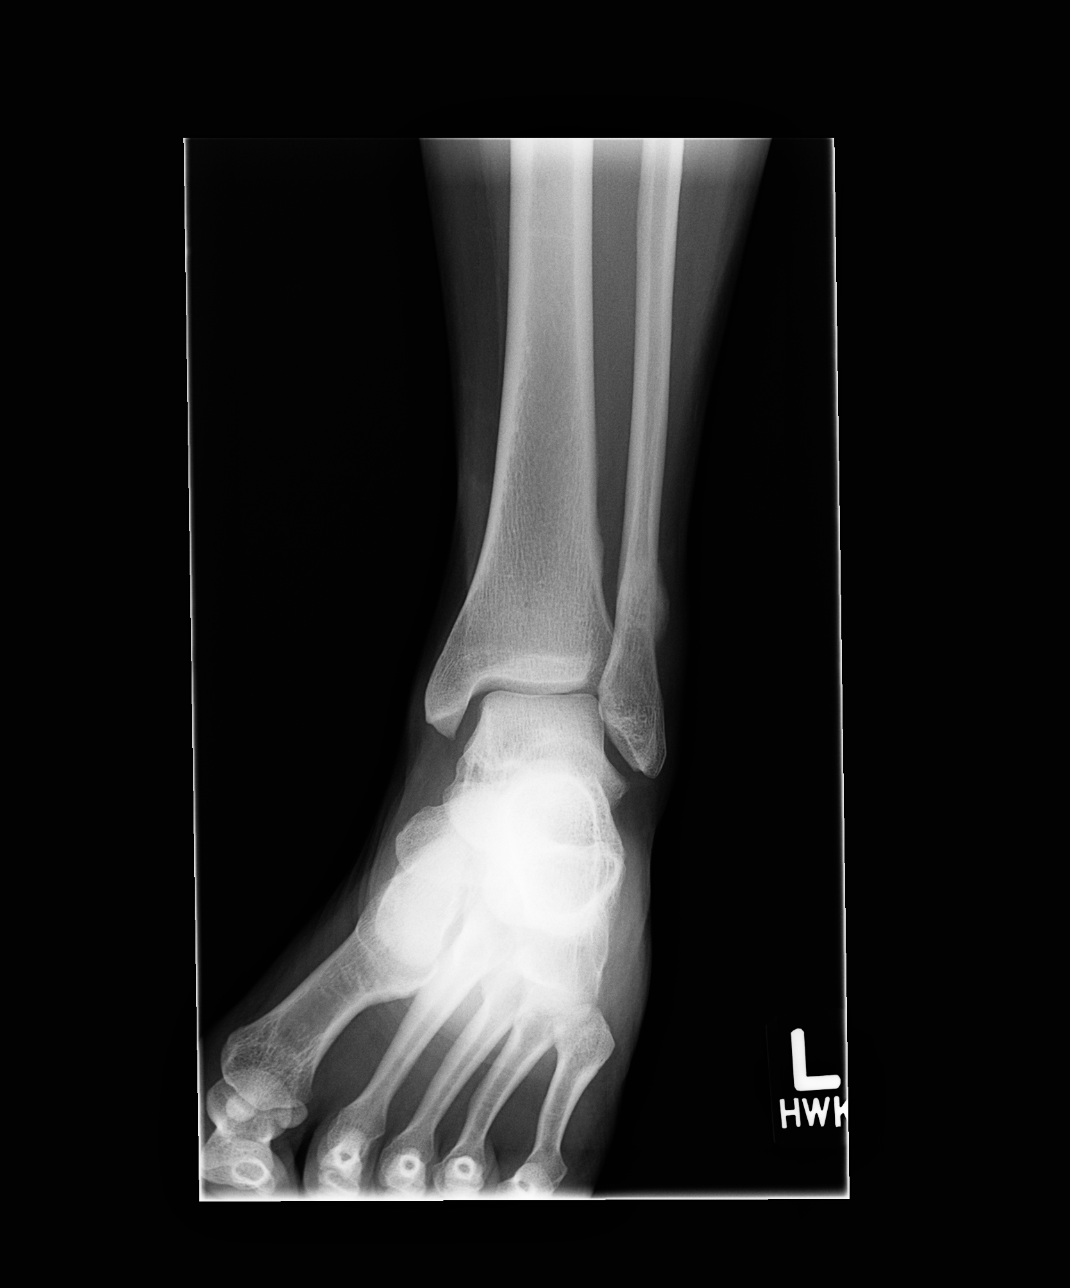

[view not recorded (3 of 3)]
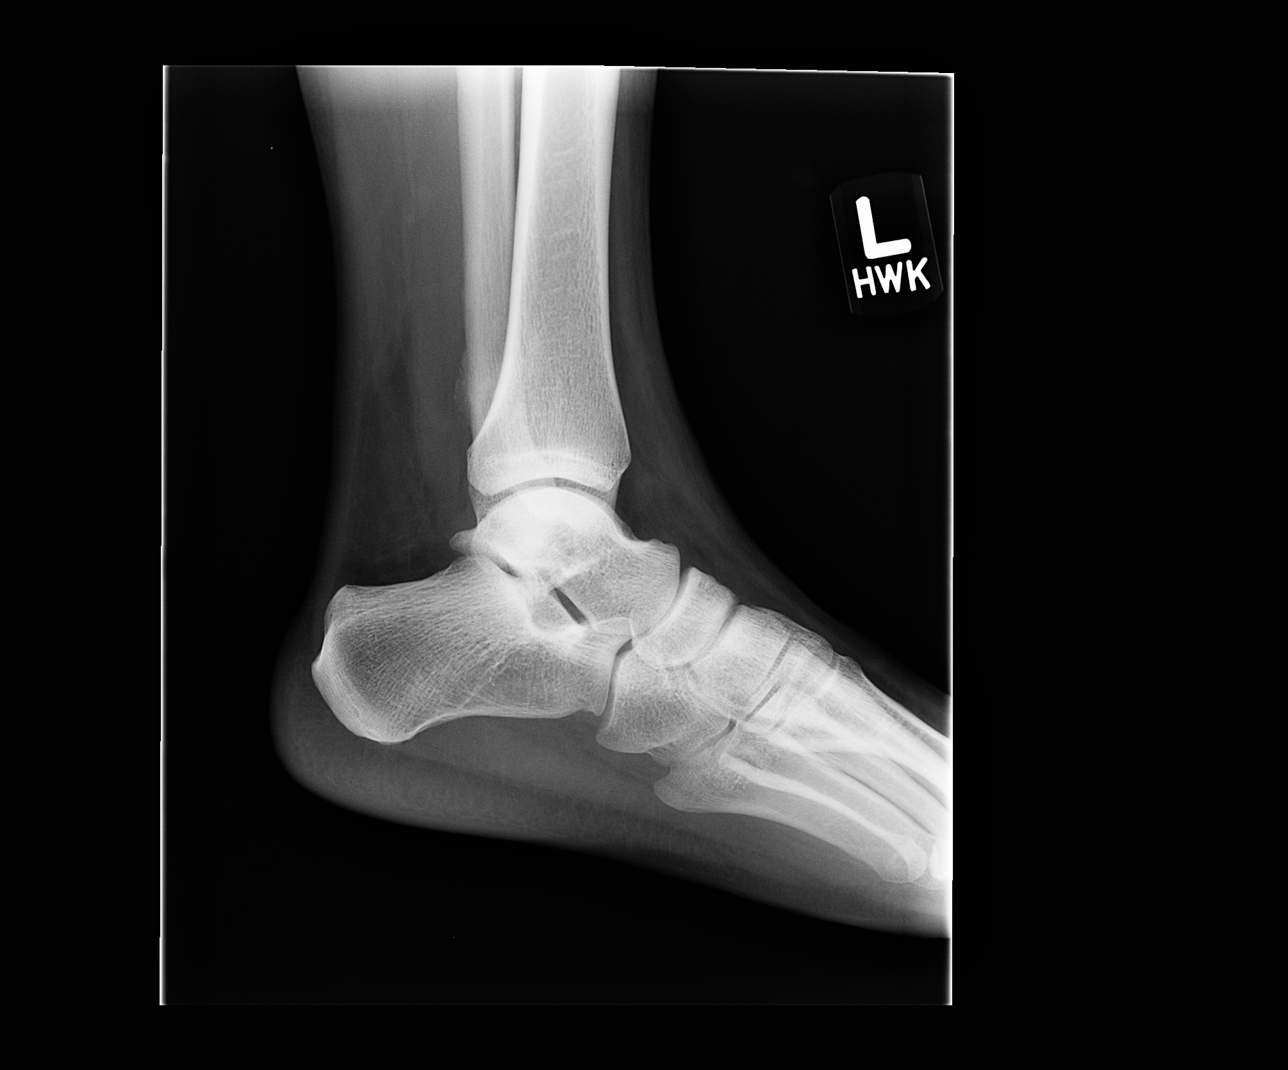

[3 of 3 positions shown; findings below may reference images not displayed]

FINDINGS: No fracture or dislocation is seen.

Suspected bony callus along the distal fibular shaft, possibly
related to prior stress injury, without definite lucency to suggest
a stress fracture. A benign exostosis is also possible but is
considered less likely.

The ankle mortise is intact.

The base of the fifth metatarsal is unremarkable.

The visualized soft tissues are unremarkable.
IMPRESSION: Suspected bony callus along the distal fibular shaft, possibly
related to prior stress injury, without evidence of fracture.

## 2014-04-12 ENCOUNTER — Ambulatory Visit (HOSPITAL_COMMUNITY)
Admission: RE | Admit: 2014-04-12 | Discharge: 2014-04-12 | Disposition: A | Discharge status: Home / Self Care | Payer: 59 | Source: Ambulatory Visit | Attending: Family Medicine | Admitting: Family Medicine

## 2014-04-12 DIAGNOSIS — Q504 Embryonic cyst of fallopian tube: Secondary | ICD-10-CM | POA: Insufficient documentation

## 2014-04-12 DIAGNOSIS — Q505 Embryonic cyst of broad ligament: Secondary | ICD-10-CM

## 2014-04-12 DIAGNOSIS — N920 Excessive and frequent menstruation with regular cycle: Secondary | ICD-10-CM

## 2014-04-25 ENCOUNTER — Ambulatory Visit (INDEPENDENT_AMBULATORY_CARE_PROVIDER_SITE_OTHER): Payer: 59 | Admitting: Family Medicine

## 2014-04-25 VITALS — BP 137/78 | HR 86 | Temp 98.8°F | Wt 153.4 lb

## 2014-04-25 DIAGNOSIS — N920 Excessive and frequent menstruation with regular cycle: Secondary | ICD-10-CM

## 2014-04-30 NOTE — Assessment & Plan Note (Signed)
Episode of prolonged heavy menses. This is her second such episode in the last 2 years. Long discussion with her. She is reassured this is likely not abnormal area she does not want to pursue any Treatment that would require her to take ongoing medication such as oral contraceptive pills. At this time shewould  like to do nothing. I did recommend that she keep a calendar of bleeding for the next year and a half so she has a second or third episode we'll have more information on her bleeding pattern. She agrees.

## 2014-04-30 NOTE — Progress Notes (Signed)
   Subjective:    Patient ID: Donna Underwood, female    DOB: 06/18/1991, 23 y.o.   MRN: [id number]  HPI Episode of heavy menses. She had a similar episode last year and was given some medication to take, Provera, but she had stopped bleeding by the time she got it filled so she never took it. Similarly, she is Re: stopped having the regular bleeding when she comes in for today's office visit. She really doesn't want to do anything that would require her to take medication on an ongoing basis.   Review of Systems No fever, sweats, chills, unusual weight change    Objective:   Physical Exam  Vital signs are reviewed GENERAL: Well-developed female no acute distress      Assessment & Plan:

## 2014-06-24 IMAGING — US US PELVIS COMPLETE
1 series · 14 of 25 positions shown · non-contrast
Comparison: None.

CLINICAL DATA: Menorrhagia

EXAM:
TRANSABDOMINAL PELVIC ULTRASOUND
TECHNIQUE: Transabdominal ultrasound of the pelvic contents was performed.
Patient refused transvaginal study.

[Series 1: us pelvis complete · 58 acquisitions, 14 frames shown]
[im 1/58]
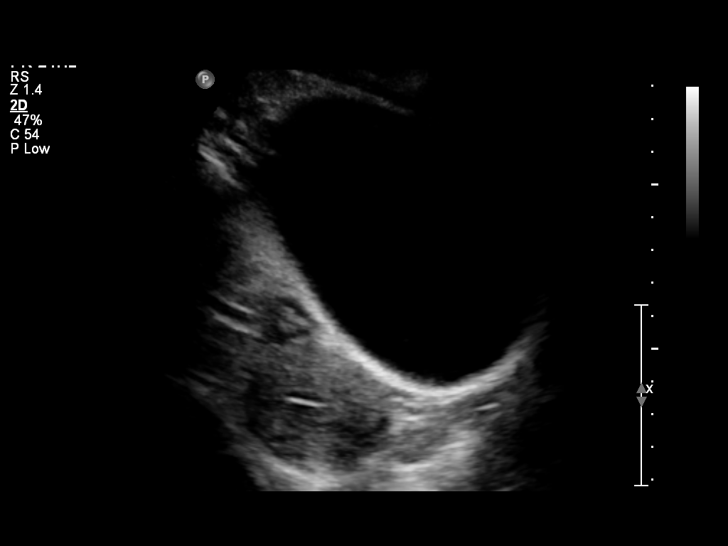
[im 5/58]
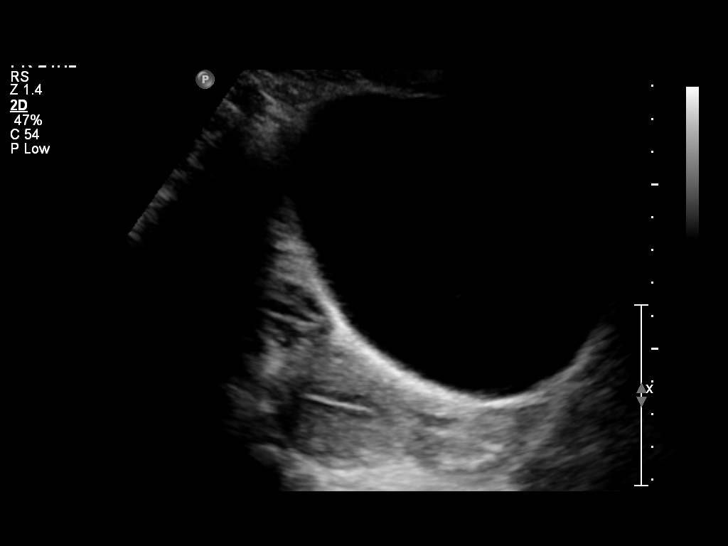
[im 10/58]
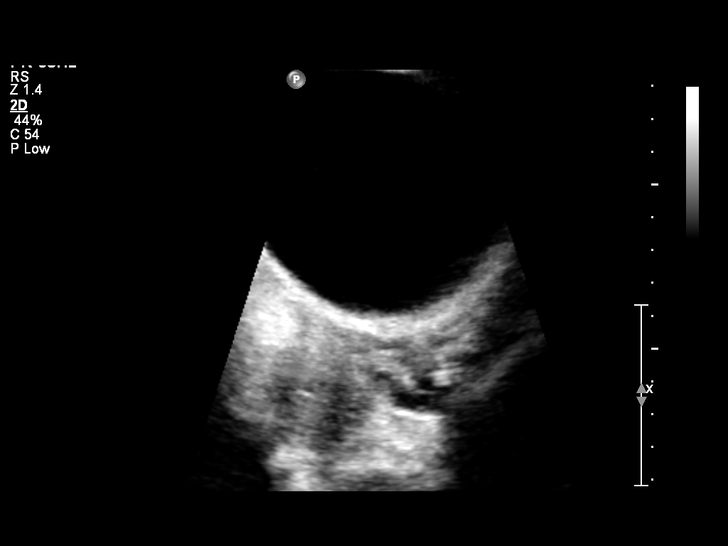
[im 15/58]
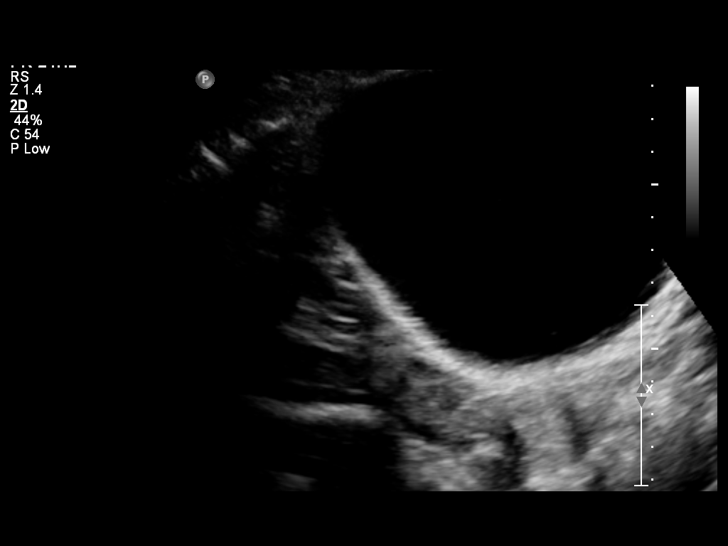
[im 20/58]
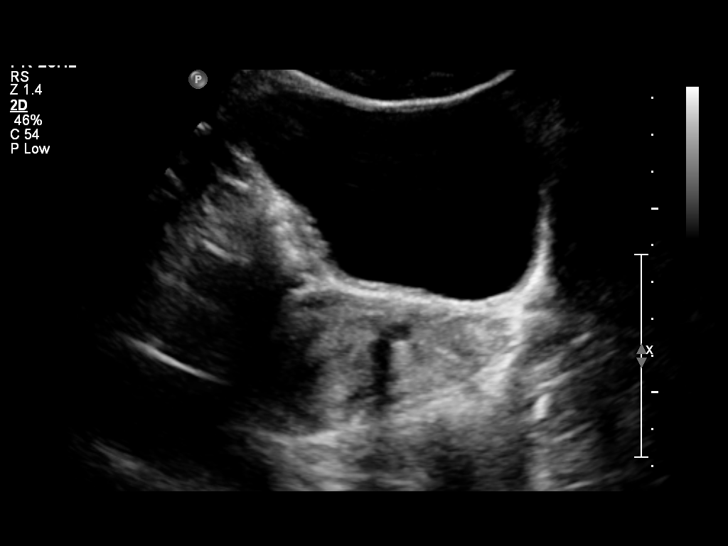
[im 22/58]
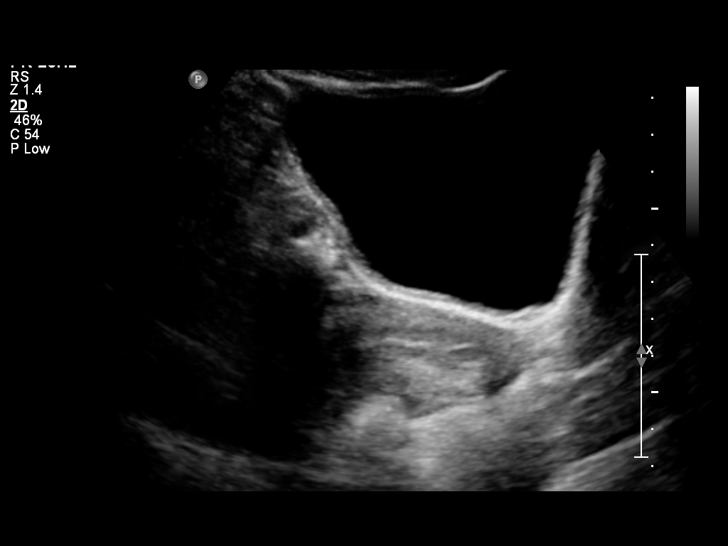
[im 27/58]
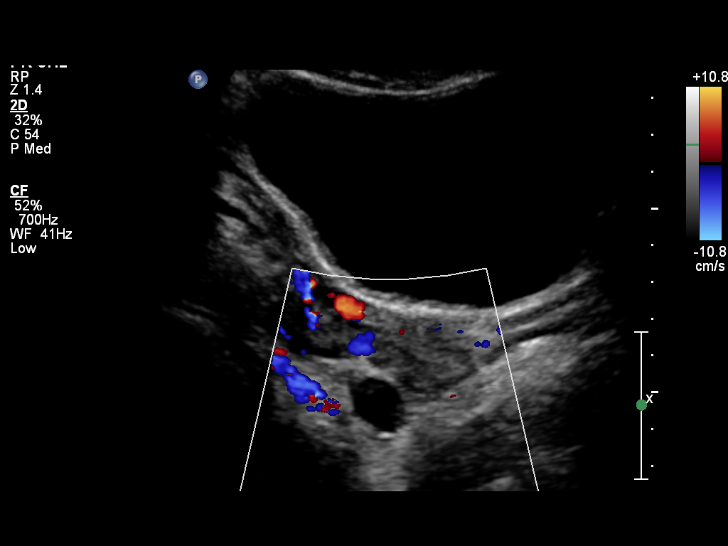
[im 31/58]
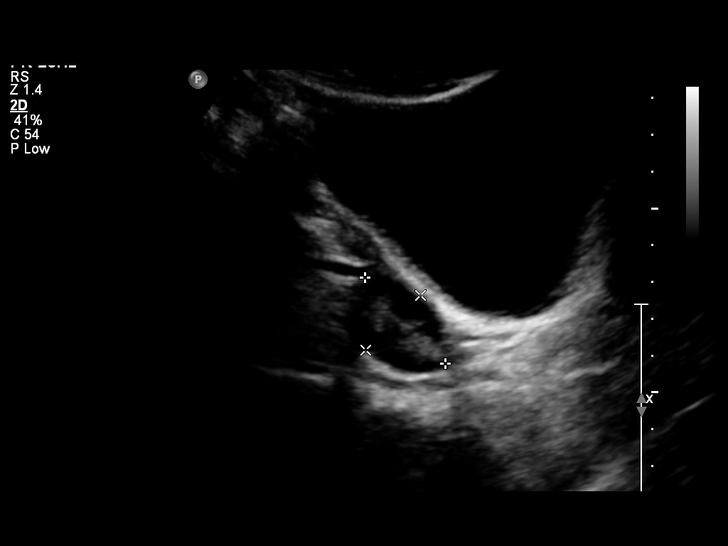
[im 36/58]
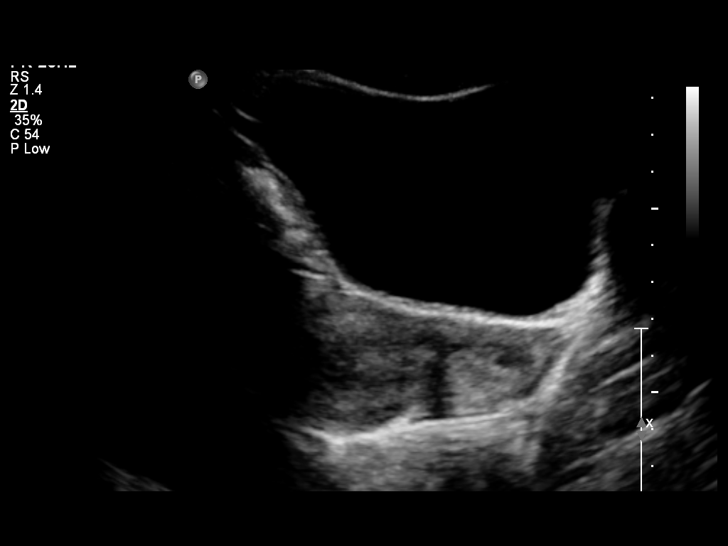
[im 39/58]
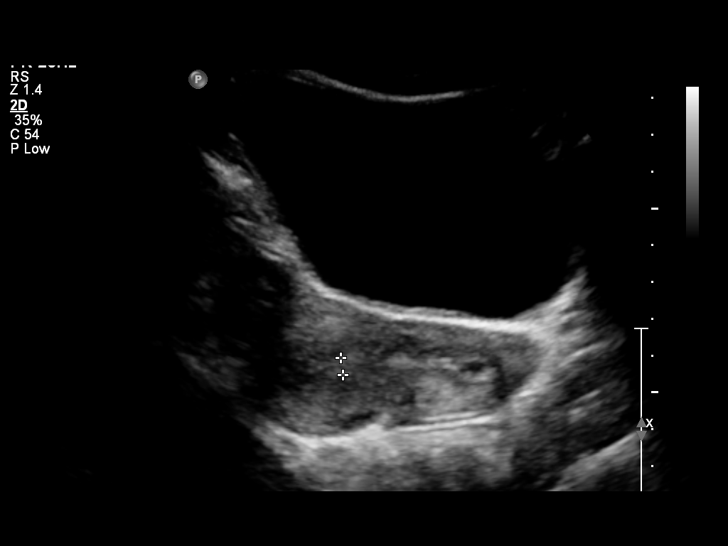
[im 43/58]
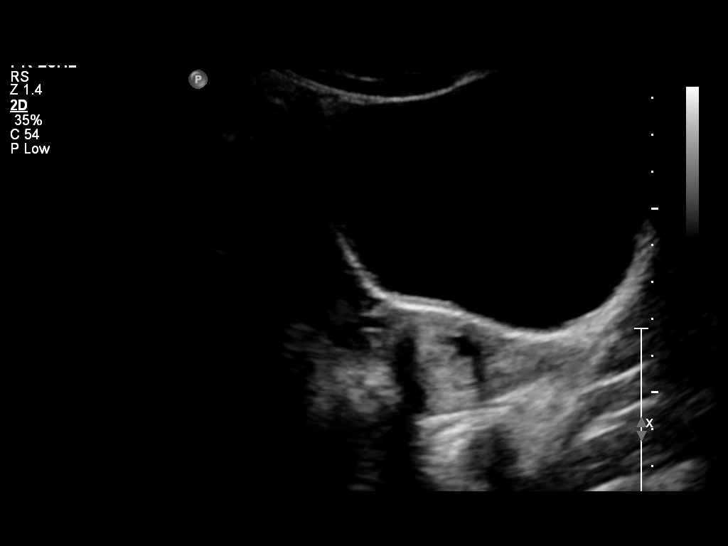
[im 48/58]
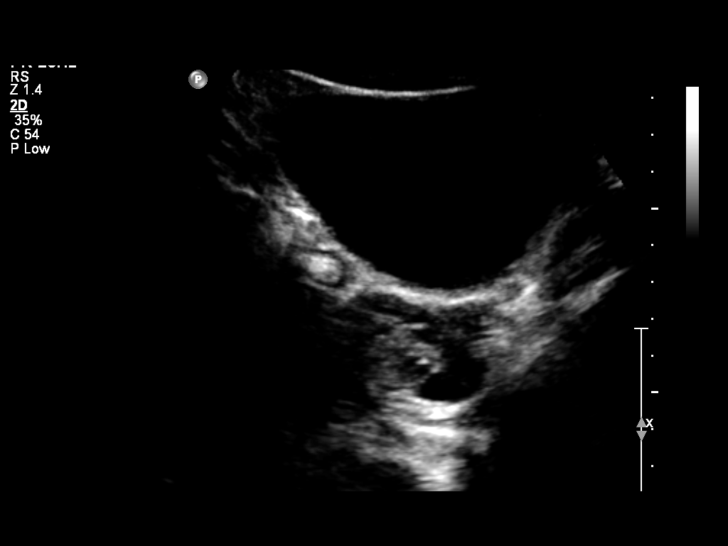
[im 53/58]
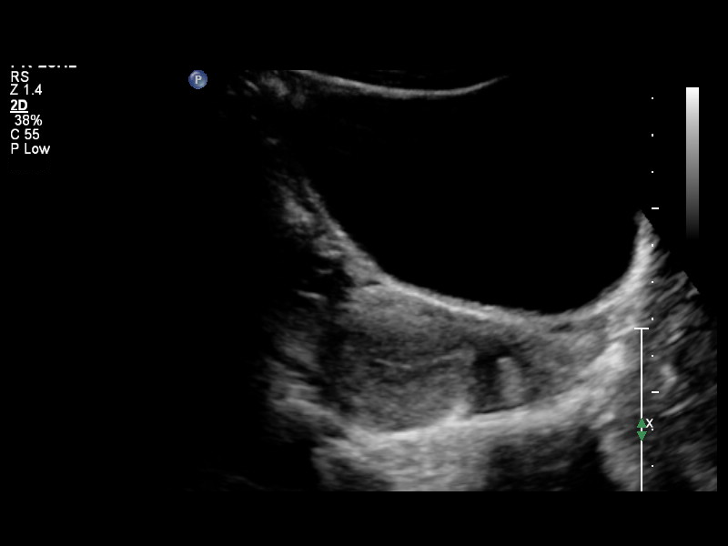
[im 58/58]
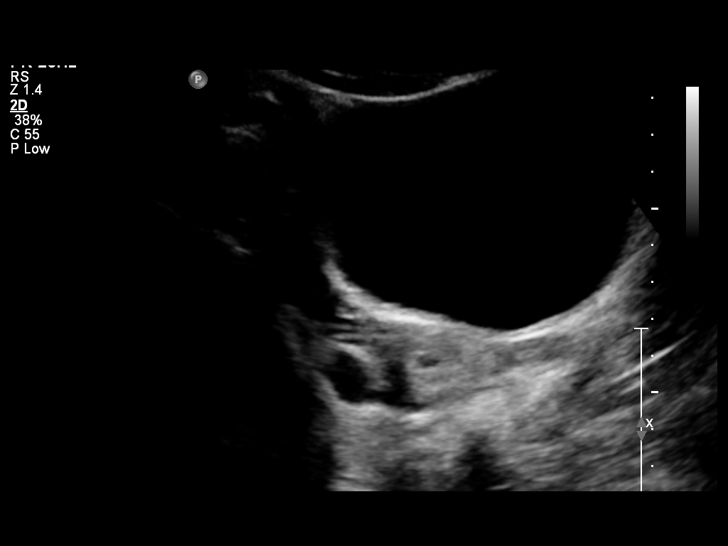

[14 of 25 positions shown; findings below may reference images not displayed]

FINDINGS: Uterus is anteverted. Uterus measures 7.1 x 3.7 x 4.1 cm in size.
There is no intrauterine mass. The endometrium measures 7 mm in
thickness with a smooth contour.

Right ovary measures 3.2 x 2.1 x 1.8 cm. Left ovary measures 3.5 x
2.4 x 1.6 cm. There is a small paraovarian cyst on the right
measuring 1.9 x 1.4 cm. There is no other extrauterine pelvic or
adnexal mass. There is no appreciable free fluid in the pelvis.
IMPRESSION: Small right paraovarian cyst.  Study otherwise unremarkable.

## 2014-09-06 ENCOUNTER — Ambulatory Visit (INDEPENDENT_AMBULATORY_CARE_PROVIDER_SITE_OTHER): Payer: 59 | Admitting: Family Medicine

## 2014-09-06 ENCOUNTER — Encounter: Payer: Self-pay | Admitting: Family Medicine

## 2014-09-06 VITALS — BP 123/87 | HR 99 | Temp 98.2°F | Wt 150.0 lb

## 2014-09-06 DIAGNOSIS — J45909 Unspecified asthma, uncomplicated: Secondary | ICD-10-CM

## 2014-09-06 DIAGNOSIS — Z23 Encounter for immunization: Secondary | ICD-10-CM

## 2014-09-06 DIAGNOSIS — N920 Excessive and frequent menstruation with regular cycle: Secondary | ICD-10-CM

## 2014-09-06 DIAGNOSIS — Z0000 Encounter for general adult medical examination without abnormal findings: Secondary | ICD-10-CM

## 2014-09-06 DIAGNOSIS — N921 Excessive and frequent menstruation with irregular cycle: Secondary | ICD-10-CM

## 2014-09-06 LAB — POCT HEMOGLOBIN: Hemoglobin: 11.5 g/dL — AB (ref 12.2–16.2)

## 2014-09-06 MED ORDER — ALBUTEROL SULFATE HFA 108 (90 BASE) MCG/ACT IN AERS: 2.0000 | INHALATION_SPRAY | Freq: Four times a day (QID) | RESPIRATORY_TRACT | Status: AC | PRN

## 2014-09-06 NOTE — Assessment & Plan Note (Signed)
Stable; no flares. Requests refill.

## 2014-09-06 NOTE — Progress Notes (Signed)
Patient ID: Crosby Bevan, female   DOB: 01/23/1991, 23 y.o.   MRN: [id number]   Subjective:   Nene Aranas is a 23 y.o. female with a history of asthma here for an annual physical exam.  Hobbies: Reading, exercising Exercise: at least twice a week, 30 minutes cardio and stretching. Diet: Balanced  Last menstrual period: 09/06/2014 Irregular periods: will bleed more lightly / spotting for weeks at a time and will also experience bleeding with clots for a week or two which usually subsides and has no period for 1-2 months at a time.  Sexually active: Never Vaginal discharge: None Dysuria:None  Last mammogram: N/A FH of breast, uterine, ovarian, colon cancer: No Breast mass or concerns: No Last pap smear: 2014; normal  History of abnormal pap smear: No   Review of Systems:  Per HPI. All other systems reviewed and are negative.  Past medical, family and social histories as well as medications were reviewed and updated.  Objective:  BP 123/87  Pulse 99  Temp(Src) 98.2 F (36.8 C) (Oral)  Wt 150 lb (68.04 kg)  LMP 09/06/2014  Gen:  23 y.o. female in NAD HEENT: MMM, EOMI, PERRL, anicteric sclerae, nares patent, oropharynx clear, no thyromegaly CV: RRR, no MRG, no JVD, 2+ radial and DP pulses bilaterally Resp: Non-labored breathing ambient air, CTAB, no wheezes noted Breasts: Appear normal, no suspicious masses, no skin or nipple changes or axillary nodes on palpation. Abd: Soft, NTND, BS present, no guarding or organomegaly Neuro: Alert and oriented, speech normal Pelvic: Deferred per pt request.   Lab Results  Component Value Date   TSH 1.055 04/04/2014   Assessment:  Shelsie Lydon is a 23 y.o. female here for annual gynecologic exam.  Plan:   Problem List Items Addressed This Visit     Respiratory   ASTHMA, INTERMITTENT - Primary   Relevant Medications      albuterol (PROVENTIL HFA;VENTOLIN HFA) 108 (90 BASE) MCG/ACT inhaler     Other   Well woman exam  (no gynecological exam)     Well woman. Due for pap for 2 years. F/u 1 year.     Heavy menstrual bleeding     Just restarted, continuous problem x2 years now feels OCP is worth trying to improve symptoms. No symptoms of anemia. Does not take iron. Will check POC hgb and Rx Fe as indicated.     Relevant Orders      Hemoglobin

## 2014-09-06 NOTE — Patient Instructions (Signed)
It was great to meet you  - I have prescribed an oral contraceptive that should regulate your cycle. Take it as directed. - I am checking your blood levels of hemoglobin today and MAY prescribe iron pills. Watch for a call or letter.  - If you have any problems, call us at [phone]; otherwise I'll see you in 1 year :)  - Dr. Jarvis Newcomer

## 2014-09-06 NOTE — Assessment & Plan Note (Addendum)
Due for pap in 2 years. F/u 1 year.

## 2014-09-06 NOTE — Assessment & Plan Note (Signed)
Just restarted, continuous problem x2 years now feels OCP is worth trying to improve symptoms. No symptoms of anemia. Does not take iron. Will check POC hgb and Rx Fe as indicated.

## 2014-09-09 ENCOUNTER — Telehealth: Payer: Self-pay | Admitting: Family Medicine

## 2014-09-09 DIAGNOSIS — N921 Excessive and frequent menstruation with irregular cycle: Secondary | ICD-10-CM

## 2014-09-09 MED ORDER — IRON (FERROUS GLUCONATE) 256 (28 FE) MG PO TABS: 1.0000 | ORAL_TABLET | Freq: Every day | ORAL | Status: DC

## 2014-09-09 NOTE — Telephone Encounter (Signed)
Pt informed. Norton Bivins [name]  

## 2014-09-09 NOTE — Telephone Encounter (Signed)
Slightly anemic. Suspect iron deficiency due to ongoing menstrual blood loss.

## 2014-09-10 ENCOUNTER — Other Ambulatory Visit: Payer: Self-pay | Admitting: *Deleted

## 2014-09-10 DIAGNOSIS — N921 Excessive and frequent menstruation with irregular cycle: Secondary | ICD-10-CM

## 2014-09-10 MED ORDER — IRON (FERROUS GLUCONATE) 256 (28 FE) MG PO TABS: 1.0000 | ORAL_TABLET | Freq: Every day | ORAL | Status: DC

## 2014-09-23 ENCOUNTER — Telehealth: Payer: Self-pay | Admitting: Family Medicine

## 2014-09-23 DIAGNOSIS — N921 Excessive and frequent menstruation with irregular cycle: Secondary | ICD-10-CM

## 2014-09-23 NOTE — Telephone Encounter (Signed)
I have never seen this patient.  Send to Dr. Jarvis NewcomerGrunz (I'm not sure why he did an annual visit if she's my patient).  Patient may benefit from having him as PCP.

## 2014-09-23 NOTE — Telephone Encounter (Signed)
Pt called and would like the oral contraceptive called in that was discussed during the last visit. Please call patient when this is done. jw

## 2014-09-25 MED ORDER — NORGESTIMATE-ETH ESTRADIOL 0.25-35 MG-MCG PO TABS: 1.0000 | ORAL_TABLET | Freq: Every day | ORAL | Status: DC

## 2014-09-25 NOTE — Telephone Encounter (Signed)
-----   Message from [name], MD sent at 09/25/2014 4:33 PM ----- Could you please let her know this has been sent in for her and that she should schedule an appointment in the next month or two to discuss how it's going?   Pt informed. Donna Underwood, Donna Underwood

## 2014-09-25 NOTE — Telephone Encounter (Signed)
Pt called again and would like Dr. Jarvis NewcomerGrunz to [facility]. jw

## 2015-10-08 ENCOUNTER — Other Ambulatory Visit (HOSPITAL_COMMUNITY)
Admission: RE | Admit: 2015-10-08 | Discharge: 2015-10-08 | Disposition: A | Discharge status: Home / Self Care | Payer: 59 | Source: Ambulatory Visit | Attending: Family Medicine | Admitting: Family Medicine

## 2015-10-08 ENCOUNTER — Other Ambulatory Visit: Payer: Self-pay

## 2015-10-08 DIAGNOSIS — Z01419 Encounter for gynecological examination (general) (routine) without abnormal findings: Secondary | ICD-10-CM | POA: Diagnosis not present

## 2015-10-09 LAB — CYTOLOGY - PAP

## 2018-07-26 ENCOUNTER — Other Ambulatory Visit (HOSPITAL_COMMUNITY)
Admission: RE | Admit: 2018-07-26 | Discharge: 2018-07-26 | Disposition: A | Discharge status: Home / Self Care | Payer: 59 | Source: Ambulatory Visit | Attending: Family Medicine | Admitting: Family Medicine

## 2018-07-26 ENCOUNTER — Other Ambulatory Visit: Payer: Self-pay | Admitting: Family Medicine

## 2018-07-26 DIAGNOSIS — Z124 Encounter for screening for malignant neoplasm of cervix: Secondary | ICD-10-CM | POA: Insufficient documentation

## 2018-07-28 LAB — CYTOLOGY - PAP: DIAGNOSIS: NEGATIVE

## 2019-05-03 ENCOUNTER — Ambulatory Visit: Payer: Self-pay | Admitting: Allergy

## 2020-11-03 ENCOUNTER — Other Ambulatory Visit (HOSPITAL_COMMUNITY)
Admission: RE | Admit: 2020-11-03 | Discharge: 2020-11-03 | Disposition: A | Discharge status: Home / Self Care | Payer: 59 | Source: Ambulatory Visit | Attending: Family Medicine | Admitting: Family Medicine

## 2020-11-03 ENCOUNTER — Other Ambulatory Visit: Payer: Self-pay | Admitting: Family Medicine

## 2020-11-03 DIAGNOSIS — Z124 Encounter for screening for malignant neoplasm of cervix: Secondary | ICD-10-CM | POA: Diagnosis not present

## 2020-11-07 LAB — CYTOLOGY - PAP
Chlamydia: NEGATIVE
Comment: NEGATIVE
Comment: NEGATIVE
Comment: NORMAL
High risk HPV: NEGATIVE
Neisseria Gonorrhea: NEGATIVE

## 2021-01-20 ENCOUNTER — Institutional Professional Consult (permissible substitution): Payer: 59 | Admitting: Plastic Surgery

## 2021-02-04 ENCOUNTER — Other Ambulatory Visit: Payer: Self-pay

## 2021-02-04 ENCOUNTER — Ambulatory Visit (HOSPITAL_COMMUNITY)
Admission: EM | Admit: 2021-02-04 | Discharge: 2021-02-04 | Disposition: A | Discharge status: Home / Self Care | Payer: 59 | Attending: Family Medicine | Admitting: Family Medicine

## 2021-02-04 ENCOUNTER — Ambulatory Visit (INDEPENDENT_AMBULATORY_CARE_PROVIDER_SITE_OTHER): Payer: 59

## 2021-02-04 ENCOUNTER — Encounter (HOSPITAL_COMMUNITY): Payer: Self-pay | Admitting: *Deleted

## 2021-02-04 DIAGNOSIS — R079 Chest pain, unspecified: Secondary | ICD-10-CM | POA: Diagnosis not present

## 2021-02-04 DIAGNOSIS — K219 Gastro-esophageal reflux disease without esophagitis: Secondary | ICD-10-CM

## 2021-02-04 MED ORDER — OMEPRAZOLE 40 MG PO CPDR: 40.0000 mg | DELAYED_RELEASE_CAPSULE | Freq: Every day | ORAL | 0 refills | Status: AC

## 2021-02-04 NOTE — Discharge Instructions (Signed)
You have been seen at the [facility] today for chest pain. Your evaluation today was not suggestive of any emergent condition requiring medical intervention at this time. Your ECG (heart tracing) did not show any worrisome changes. However, some medical problems make take more time to appear. Therefore, it's very important that you pay attention to any new symptoms or worsening of your current condition.  Please proceed directly to the Emergency Department immediately should you feel worse in any way or have any of the following symptoms: increasing or different chest pain, pain that spreads to your arm, neck, jaw, back or abdomen, shortness of breath, or nausea and vomiting.  

## 2021-02-04 NOTE — ED Triage Notes (Signed)
Pt reports she woke up at 0500 this morning with central CP and Lt arm pain. Pt also had ABD pain with bloating and nausea.  Pt does report eating Oodles O' Noodles with Hot sauce just before going to bed last night. Pt also has a Hx of GERD.

## 2021-02-07 NOTE — ED Provider Notes (Signed)
[facility]ARE CENTER   [id number] 02/04/21 Arrival Time: 1403  ASSESSMENT & PLAN:  1. Chest pain, unspecified type   2. Gastroesophageal reflux disease without esophagitis     Patient history and exam consistent with non-cardiac cause of chest pain. Conservative measures indicated.  ECG: Performed today and interpreted by me: NSR; no STEMI.  I have personally viewed the imaging studies ordered this visit. Normal CXR.  Discussed likely GERD. Begin trial of: Meds ordered this encounter  Medications  . omeprazole (PRILOSEC) 40 MG capsule    Sig: Take 1 capsule (40 mg total) by mouth daily.    Dispense:  30 capsule    Refill:  0     Discharge Instructions     You have been seen at the [facility] today for chest pain. Your evaluation today was not suggestive of any emergent condition requiring medical intervention at this time. Your ECG (heart tracing) did not show any worrisome changes. However, some medical problems make take more time to appear. Therefore, it's very important that you pay attention to any new symptoms or worsening of your current condition.  Please proceed directly to the Emergency Department immediately should you feel worse in any way or have any of the following symptoms: increasing or different chest pain, pain that spreads to your arm, neck, jaw, back or abdomen, shortness of breath, or nausea and vomiting.      Follow-up Information    [facility] EMERGENCY DEPARTMENT.   Specialty: Emergency Medicine Why: If symptoms worsen in any way. Contact information: [address] [id number] mc [place] [address] [phone]              Chest pain precautions given. Reviewed expectations re: course of current medical issues. Questions answered. Outlined signs and symptoms indicating need for more acute intervention. Patient verbalized understanding. After Visit Summary  given.   SUBJECTIVE:  History from: patient. Donna Underwood is a 30 y.o. female who presents with complaint of anterior and left chest pain; this am. Last evening and this am with abd discomfort and bloating. Burping more frequently. Noted this after eating food with hot sauce before bed. No resp or swallowing difficulties. Without assoc n/v/diaphoresis. No OTC tx.  Social History   Tobacco Use  Smoking Status Never Smoker  Smokeless Tobacco Never Used   Social History   Substance and Sexual Activity  Alcohol Use Yes  . Alcohol/week: 1.0 standard drink  . Types: 1 drink(s) per week   Comment: drank once.      OBJECTIVE:  Vitals:   02/04/21 1413  BP: (!) 154/99  Pulse: 81  Resp: 16  Temp: 98.9 F (37.2 C)  TempSrc: Oral  SpO2: 100%    General appearance: alert, oriented, no acute distress Eyes: PERRLA; EOMI; conjunctivae normal HENT: normocephalic; atraumatic Neck: supple with FROM Lungs: without labored respirations; speaks full sentences without difficulty; CTAB Heart: regular rate and rhythm without murmer Chest Wall: without tenderness to palpation Abdomen: soft, non-tender; no guarding or rebound tenderness Extremities: without edema; without calf swelling or tenderness; symmetrical without gross deformities Skin: warm and dry; without rash or lesions Neuro: normal gait Psychological: alert and cooperative; normal mood and affect   Imaging: DG Chest 2 View  Result Date: 02/04/2021 CLINICAL DATA:  30 year old female with chest pain. EXAM: CHEST - 2 VIEW COMPARISON:  None. FINDINGS: The heart size and mediastinal contours are within normal limits. Both lungs are clear. The visualized skeletal structures are unremarkable.  IMPRESSION: No active cardiopulmonary disease. Electronically Signed   By: Elgie Collard M.D.   On: 02/04/2021 15:00     Allergies  Allergen Reactions  . Bactrim [Sulfamethoxazole-Trimethoprim] Hives  . Ceclor [Cefaclor]     Does  not know. Had reaction when she was child.    Past Medical History:  Diagnosis Date  . Asthma    since childhood, exercise induced now   Social History   Socioeconomic History  . Marital status: Single    Spouse name: Not on file  . Number of children: Not on file  . Years of education: 61   . Highest education level: Not on file  Occupational History    Employer: [employer]  . Occupation: Neurosurgeon: Advertising copywriter  . Occupation: Consulting civil engineer    Comment: studying online for MSW   Tobacco Use  . Smoking status: Never Smoker  . Smokeless tobacco: Never Used  Substance and Sexual Activity  . Alcohol use: Yes    Alcohol/week: 1.0 standard drink    Types: 1 drink(s) per week    Comment: drank once.   . Drug use: No  . Sexual activity: Never    Birth control/protection: None  Other Topics Concern  . Not on file  Social History Narrative   Lives with mom and sister.          Social Determinants of Health   Financial Resource Strain: Not on file  Food Insecurity: Not on file  Transportation Needs: Not on file  Physical Activity: Not on file  Stress: Not on file  Social Connections: Not on file  Intimate Partner Violence: Not on file   Family History  Problem Relation Age of Onset  . Hypertension Mother   . Eczema Sister    Past Surgical History:  Procedure Laterality Date  . MYRINGOTOMY  2002     Mardella Layman, MD 02/07/21 [phone]

## 2021-03-31 ENCOUNTER — Institutional Professional Consult (permissible substitution): Payer: 59 | Admitting: Plastic Surgery

## 2021-04-18 IMAGING — DX DG CHEST 2V
2 series · 2 of 2 positions shown · non-contrast
Comparison: None.

CLINICAL DATA: 29-year-old female with chest pain.

EXAM:
CHEST - 2 VIEW

[chest pa]
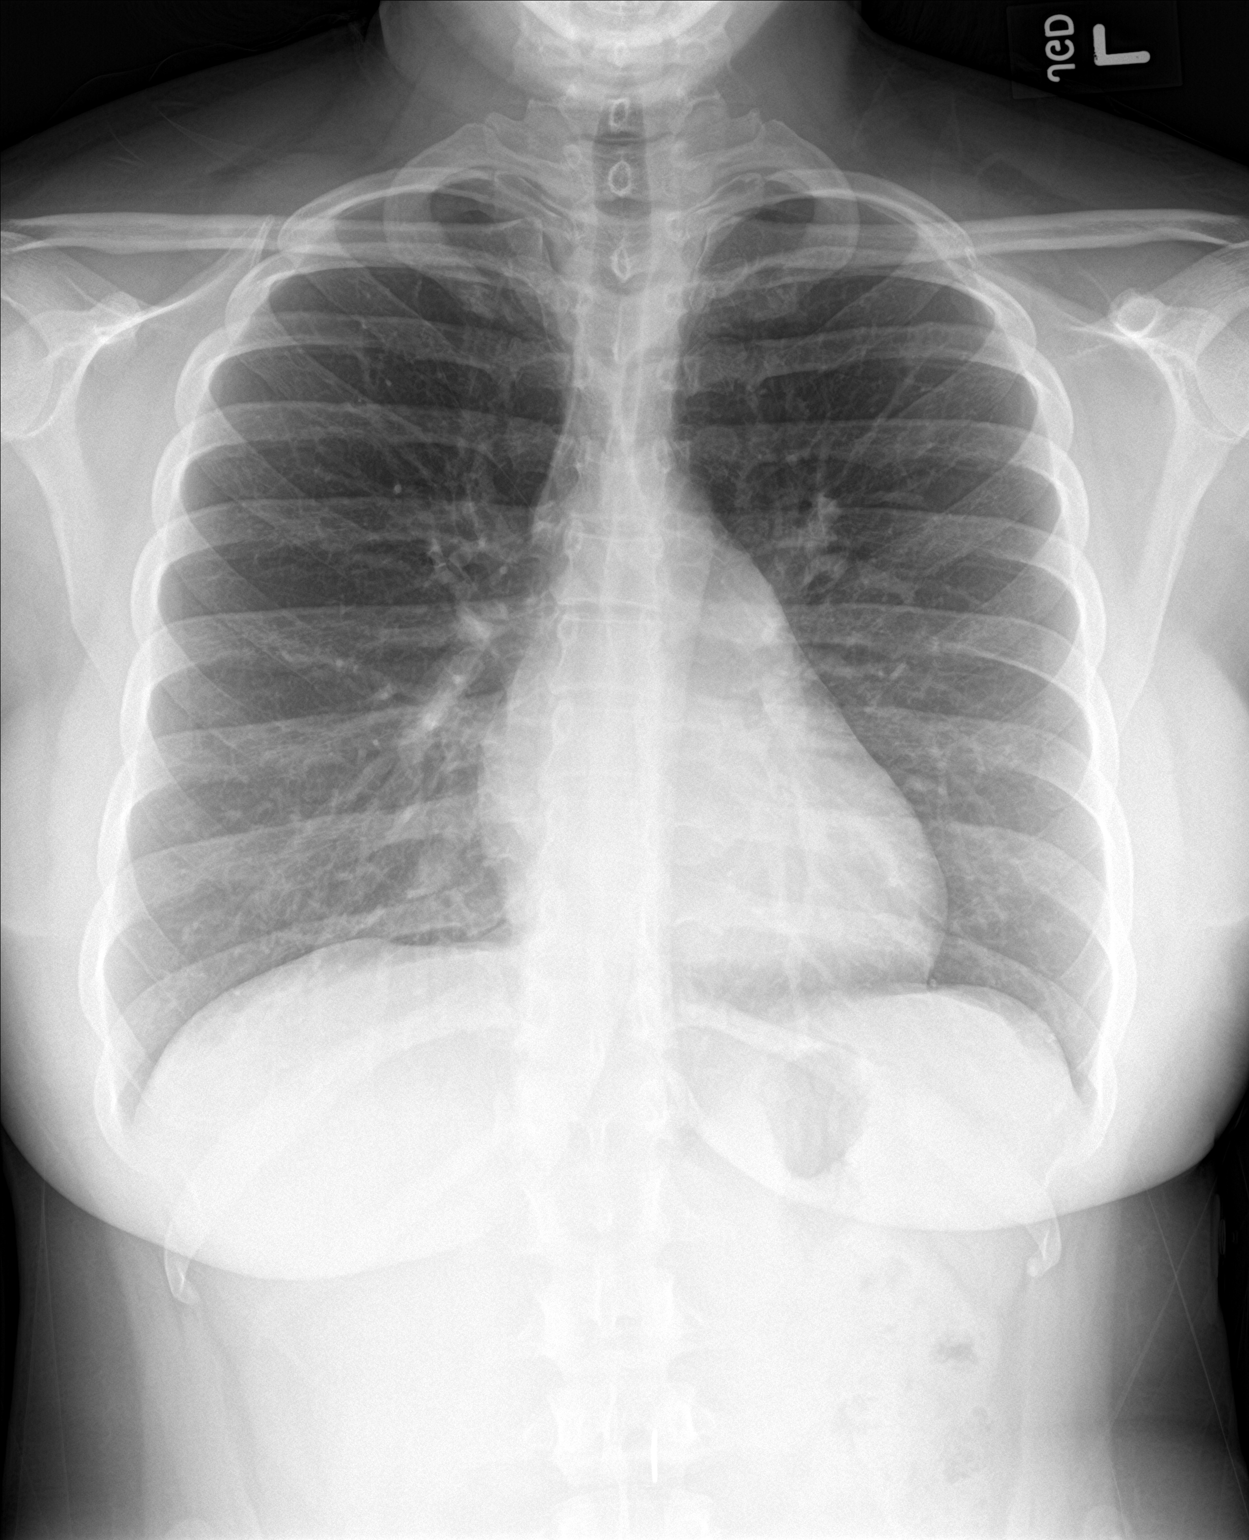

[chest lat]
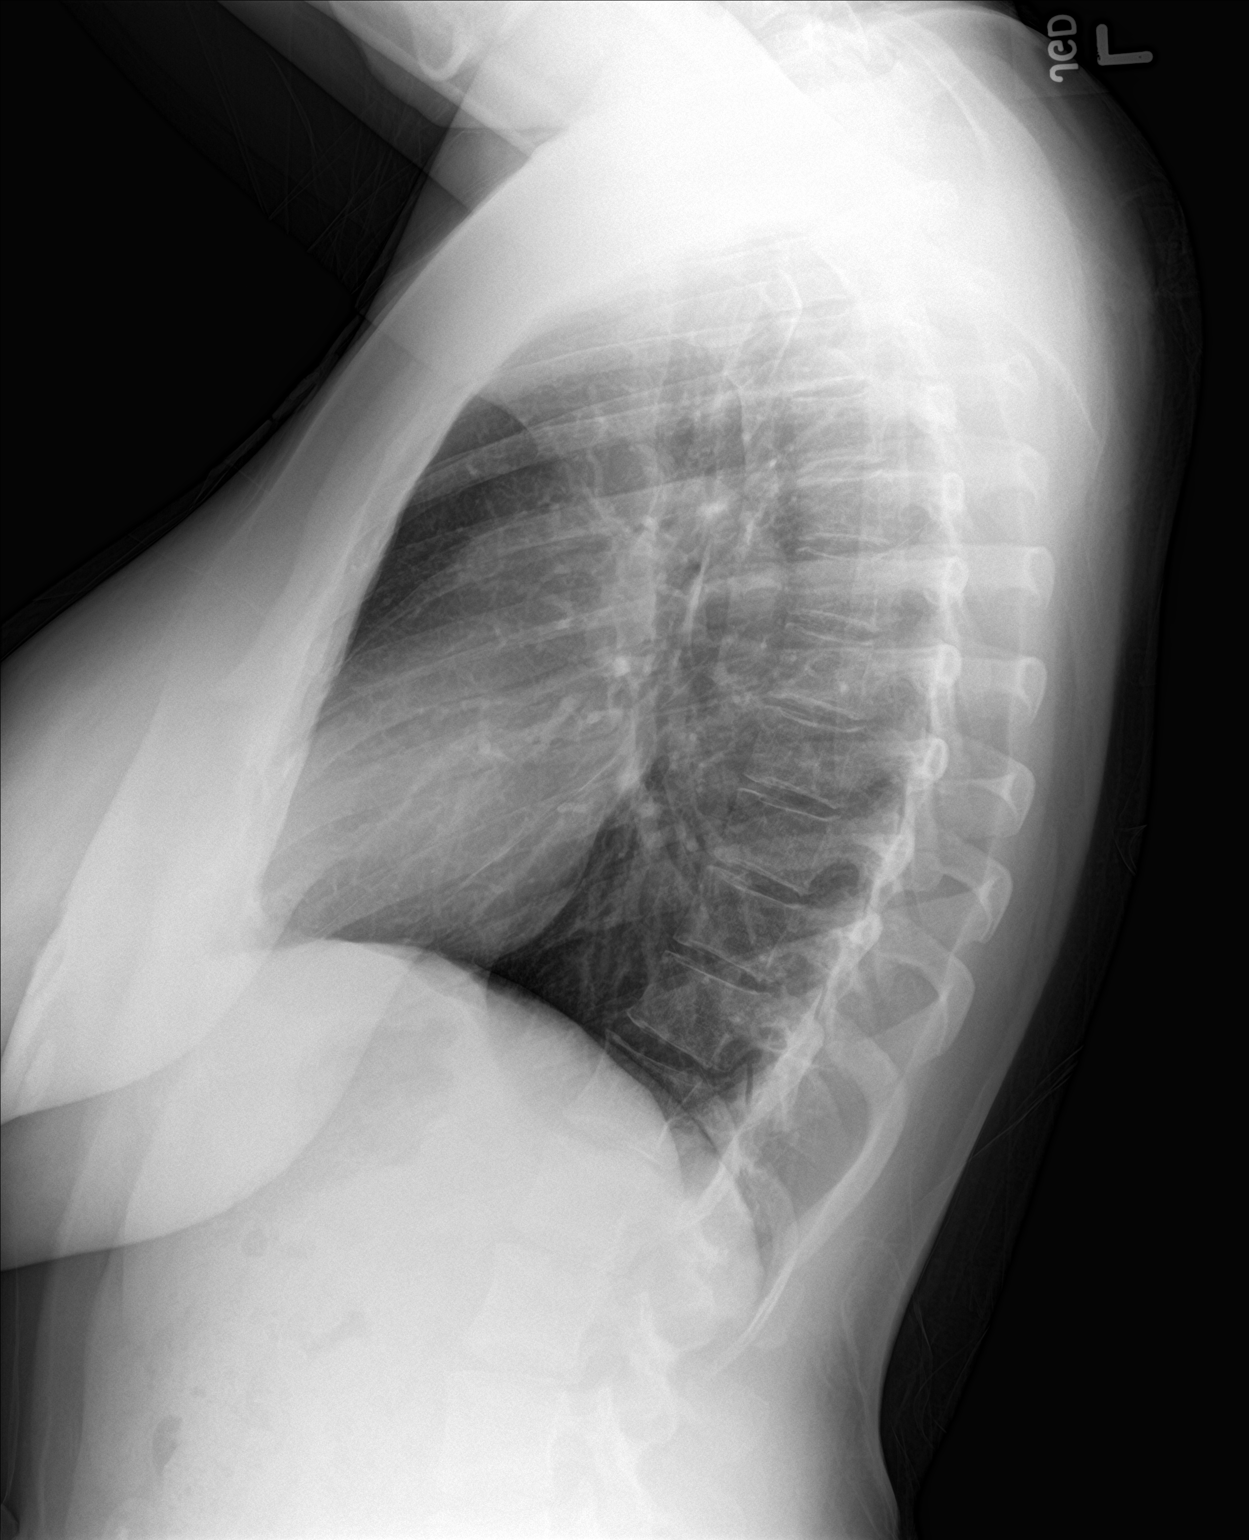

[2 of 2 positions shown; findings below may reference images not displayed]

FINDINGS: The heart size and mediastinal contours are within normal limits.
Both lungs are clear. The visualized skeletal structures are
unremarkable.
IMPRESSION: No active cardiopulmonary disease.

## 2021-06-23 ENCOUNTER — Institutional Professional Consult (permissible substitution): Payer: 59 | Admitting: Plastic Surgery

## 2021-08-25 ENCOUNTER — Institutional Professional Consult (permissible substitution): Payer: 59 | Admitting: Plastic Surgery

## 2021-08-31 ENCOUNTER — Encounter (HOSPITAL_COMMUNITY): Payer: Self-pay | Admitting: *Deleted

## 2021-12-18 ENCOUNTER — Ambulatory Visit (INDEPENDENT_AMBULATORY_CARE_PROVIDER_SITE_OTHER): Payer: 59 | Admitting: Plastic Surgery

## 2021-12-18 ENCOUNTER — Encounter: Payer: Self-pay | Admitting: Plastic Surgery

## 2021-12-18 ENCOUNTER — Other Ambulatory Visit: Payer: Self-pay

## 2021-12-18 ENCOUNTER — Institutional Professional Consult (permissible substitution): Payer: 59 | Admitting: Plastic Surgery

## 2021-12-18 DIAGNOSIS — M546 Pain in thoracic spine: Secondary | ICD-10-CM | POA: Diagnosis not present

## 2021-12-18 DIAGNOSIS — N62 Hypertrophy of breast: Secondary | ICD-10-CM | POA: Diagnosis not present

## 2021-12-18 DIAGNOSIS — M542 Cervicalgia: Secondary | ICD-10-CM | POA: Diagnosis not present

## 2021-12-18 DIAGNOSIS — M549 Dorsalgia, unspecified: Secondary | ICD-10-CM | POA: Insufficient documentation

## 2021-12-18 DIAGNOSIS — G8929 Other chronic pain: Secondary | ICD-10-CM | POA: Diagnosis not present

## 2021-12-18 NOTE — Progress Notes (Signed)
Patient ID: Donna Underwood, female    DOB: 1991/09/21, 31 y.o.   MRN: [id number]   Chief Complaint  Patient presents with   Advice Only    Mammary Hyperplasia: The patient is a 31 y.o. female with a history of mammary hyperplasia for several years.  She has extremely large breasts causing symptoms that include the following: Back pain in the upper and lower back, including neck pain. She pulls or pins her bra straps to provide better lift and relief of the pressure and pain. She notices relief by holding her breast up manually.  Her shoulder straps cause grooves and pain and pressure that requires padding for relief. Pain medication is sometimes required with motrin and tylenol.  Activities that are hindered by enlarged breasts include: exercise and running.  She has tried supportive clothing as well as fitted bras without improvement.  Her breasts are extremely large and fairly symmetric.  She has hyperpigmentation of the inframammary area on both sides.  The sternal to nipple distance on the right is 31 cm and the left is 31 cm.  The IMF distance is 16 cm.  She is 5 feet 2 inches tall and weighs 159 pounds.  The BMI = 29.1 kg/m.  Preoperative bra size = 34 F cup. She would like to be a B/C cup. The estimated excess breast tissue to be removed at the time of surgery = 375 grams on the left and 375 grams on the right.  Mammogram history: none.  Family history of breast cancer:  none.  Tobacco use:  none.   The patient expresses the desire to pursue surgical intervention.   Review of Systems  Constitutional: Negative.   HENT: Negative.    Eyes: Negative.   Respiratory: Negative.    Cardiovascular: Negative.   Gastrointestinal: Negative.   Endocrine: Negative.   Genitourinary: Negative.   Musculoskeletal:  Positive for back pain and neck pain.  Neurological: Negative.   Hematological: Negative.    Past Medical History:  Diagnosis Date   Asthma    since childhood, exercise induced  now    Past Surgical History:  Procedure Laterality Date   MYRINGOTOMY  2002      Current Outpatient Medications:    albuterol (PROVENTIL HFA;VENTOLIN HFA) 108 (90 BASE) MCG/ACT inhaler, Inhale 2 puffs into the lungs every 6 (six) hours as needed for wheezing., Disp: 1 Inhaler, Rfl: 11   fluticasone (FLONASE) 50 MCG/ACT nasal spray, 1-2 sprays in each nostril, Disp: , Rfl:    loratadine (CLARITIN) 10 MG tablet, Take 1 tablet (10 mg total) by mouth daily., Disp: 30 tablet, Rfl: 11   omeprazole (PRILOSEC) 40 MG capsule, Take 1 capsule (40 mg total) by mouth daily., Disp: 30 capsule, Rfl: 0   ipratropium (ATROVENT) 0.06 % nasal spray, Place 2 sprays into both nostrils 4 (four) times daily. (Patient not taking: Reported on 12/18/2021), Disp: 15 mL, Rfl: 12   Iron, Ferrous Gluconate, 256 (28 FE) MG TABS, Take 1 tablet by mouth daily. (Patient not taking: Reported on 12/18/2021), Disp: 90 tablet, Rfl: 3   norgestimate-ethinyl estradiol (ORTHO-CYCLEN,SPRINTEC,PREVIFEM) 0.25-35 MG-MCG tablet, Take 1 tablet by mouth daily. (Patient not taking: Reported on 12/18/2021), Disp: 1 Package, Rfl: 11   Objective:   Vitals:   12/18/21 0848  BP: 131/87  Pulse: 78  SpO2: 99%    Physical Exam Vitals and nursing note reviewed.  Constitutional:      Appearance: Normal appearance.  HENT:     Head:  Normocephalic and atraumatic.  Cardiovascular:     Rate and Rhythm: Normal rate.     Pulses: Normal pulses.  Pulmonary:     Effort: Pulmonary effort is normal. No respiratory distress.  Abdominal:     General: There is no distension.     Palpations: Abdomen is soft.     Tenderness: There is no abdominal tenderness.  Musculoskeletal:        General: No swelling or deformity.  Skin:    General: Skin is warm.     Coloration: Skin is not jaundiced.     Findings: No bruising.  Neurological:     Mental Status: She is alert and oriented to person, place, and time.  Psychiatric:        Mood and Affect: Mood  normal.        Behavior: Behavior normal.        Thought Content: Thought content normal.        Judgment: Judgment normal.    Assessment & Plan:  Chronic bilateral thoracic back pain  Neck pain  Symptomatic mammary hypertrophy  The procedure the patient selected and that was best for the patient was discussed. The risk were discussed and include but not limited to the following:  Breast asymmetry, fluid accumulation, firmness of the breast, inability to breast feed, loss of nipple or areola, skin loss, change in skin and nipple sensation, fat necrosis of the breast tissue, bleeding, infection and healing delay.  There are risks of anesthesia and injury to nerves or blood vessels.  Allergic reaction to tape, suture and skin glue are possible.  There will be swelling.  Any of these can lead to the need for revisional surgery.  A breast reduction has potential to interfere with diagnostic procedures in the future.  This procedure is best done when the breast is fully developed.  Changes in the breast will continue to occur over time: pregnancy, weight gain or weigh loss.    Total time: 40 minutes. This includes time spent with the patient during the visit as well as time spent before and after the visit reviewing the chart, documenting the encounter and ordering pertinent studies. and literature emailed to the patient.   Physical therapy:  ordered Mammogram:  none  Bilateral breast reduction with possible liposuction.  She knows to give [place] a call when she has finished with her physical therapy.  In the meantime we will look into her insurance and see if we can get this submitted.    Pictures were obtained of the patient and placed in the chart with the patient's or guardian's permission.  Alena Bills Bernadette Gores, DO

## 2022-02-19 ENCOUNTER — Institutional Professional Consult (permissible substitution): Payer: 59 | Admitting: Plastic Surgery

## 2022-03-23 ENCOUNTER — Telehealth: Payer: Self-pay | Admitting: Plastic Surgery

## 2022-03-23 NOTE — Telephone Encounter (Signed)
Called patient to schedule surgery. She wanted to see if surgery could be scheduled sometime in late August. She is doing a lot of traveling over the next several months and would prefer to wait until that slows before scheduling surgery. I did advise patient that some insurance companies require a new authorization to be submitted but that I would call her insurance to find out. Patient will follow up with [place] next week to see what needs to happen next regarding scheduling surgery late summer/early fall.  ?

## 2022-03-30 ENCOUNTER — Telehealth: Payer: Self-pay | Admitting: Plastic Surgery

## 2022-03-30 NOTE — Telephone Encounter (Signed)
Returned patients call.  Requested call back.

## 2022-03-31 ENCOUNTER — Telehealth: Payer: Self-pay | Admitting: Plastic Surgery

## 2022-03-31 NOTE — Telephone Encounter (Signed)
Lvm for callback to discuss surgery date ?

## 2022-04-16 NOTE — Progress Notes (Signed)
? ?  Patient ID: Donna Underwood, female    DOB: [birth year], 31 y.o.   MRN: [id number] ? ?Chief Complaint  ?Patient presents with  ? Pre-op Exam  ? ? ?  ICD-10-CM   ?1. Symptomatic mammary hypertrophy  N62   ?  ? ? ? ?History of Present Illness: ?Donna Underwood is a 31 y.o.  female  with a history of macromastia.  She presents for preoperative evaluation for upcoming procedure, bilateral breast reduction with possible liposuction, scheduled for 05/05/2022 with Dr. Ulice Bold. ? ?The patient has not had problems with anesthesia.  Her only previous surgical history was getting tympanostomy tubes as a child.  She is not on OCPs or other contraceptive medications.  She does not smoke or use any other nicotine-containing products.  She confirms that she is a 34 F cup and would like to be a B or C cup.  She understands that we may not be able to take off as much as she would like in order to preserve the nipple and shape of the breast and is okay with that.  Denies any personal or family history of blood clots or clotting disorder.  No personal history of cancer, MI, or CVA.  She is not on any blood thinners.  She does have a history of exercise-induced asthma, but has not had to use her inhaler in over a year.  No history of keloiding. ? ?Summary of Previous Visit: She was seen for consult on 12/18/2021.  At that time, complained of chronic upper back and neck discomfort in the context of large breasts.  STN 31 cm each side.  BMI equals 29.1 kg/m?Marland Kitchen  Preoperative bra size equals 34 F cup.  She expressed that she would like to be a B or C cup.  Estimated excess breast tissue removed at time of surgery equals 375 g each side. ? ?Job: Works from home for [employer] as well as part-time with [employer].  Discussed 2 weeks FMLA. ? ?PMH Significant for: Macromastia, allergic rhinitis, asthma, GERD. ? ? ?Past Medical History: ?Allergies: ?Allergies  ?Allergen Reactions  ? Bactrim [Sulfamethoxazole-Trimethoprim] Hives   ? Ceclor [Cefaclor]   ?  Does not know. Had reaction when she was child.  ? ? ?Current Medications: ? ?Current Outpatient Medications:  ?  albuterol (PROVENTIL HFA;VENTOLIN HFA) 108 (90 BASE) MCG/ACT inhaler, Inhale 2 puffs into the lungs every 6 (six) hours as needed for wheezing., Disp: 1 Inhaler, Rfl: 11 ?  fluticasone (FLONASE) 50 MCG/ACT nasal spray, 1-2 sprays in each nostril, Disp: , Rfl:  ?  ipratropium (ATROVENT) 0.06 % nasal spray, Place 2 sprays into both nostrils 4 (four) times daily. (Patient not taking: Reported on 12/18/2021), Disp: 15 mL, Rfl: 12 ?  Iron, Ferrous Gluconate, 256 (28 FE) MG TABS, Take 1 tablet by mouth daily. (Patient not taking: Reported on 12/18/2021), Disp: 90 tablet, Rfl: 3 ?  loratadine (CLARITIN) 10 MG tablet, Take 1 tablet (10 mg total) by mouth daily., Disp: 30 tablet, Rfl: 11 ?  norgestimate-ethinyl estradiol (ORTHO-CYCLEN,SPRINTEC,PREVIFEM) 0.25-35 MG-MCG tablet, Take 1 tablet by mouth daily. (Patient not taking: Reported on 12/18/2021), Disp: 1 Package, Rfl: 11 ?  omeprazole (PRILOSEC) 40 MG capsule, Take 1 capsule (40 mg total) by mouth daily., Disp: 30 capsule, Rfl: 0 ? ?Past Medical Problems: ?Past Medical History:  ?Diagnosis Date  ? Asthma   ? since childhood, exercise induced now  ? ? ?Past Surgical History: ?Past Surgical History:  ?Procedure Laterality Date  ?  MYRINGOTOMY  2002  ? ? ?Social History: ?Social History  ? ?Socioeconomic History  ? Marital status: Single  ?  Spouse name: Not on file  ? Number of children: Not on file  ? Years of education: 4712   ? Highest education level: Not on file  ?Occupational History  ?  Employer: [employer]  ? Occupation: insurance  ?  Employer: [employer]  ? Occupation: student  ?  Comment: studying online for MSW   ?Tobacco Use  ? Smoking status: Never  ? Smokeless tobacco: Never  ?Substance and Sexual Activity  ? Alcohol use: Yes  ?  Alcohol/week: 1.0 standard drink  ?  Types: 1 drink(s) per week  ?  Comment: drank once.   ?  Drug use: No  ? Sexual activity: Never  ?  Birth control/protection: None  ?Other Topics Concern  ? Not on file  ?Social History Narrative  ? ** Merged History Encounter **  ?    ? Lives with mom and sister.   ?   ?   ? ?Social Determinants of Health  ? ?Financial Resource Strain: Not on file  ?Food Insecurity: Not on file  ?Transportation Needs: Not on file  ?Physical Activity: Not on file  ?Stress: Not on file  ?Social Connections: Not on file  ?Intimate Partner Violence: Not on file  ? ? ?Family History: ?Family History  ?Problem Relation Age of Onset  ? Hypertension Mother   ? Eczema Sister   ? ? ?Review of Systems: ?ROS ?Denies any recent fevers, chest pain, difficulty breathing, or leg swelling. ? ?Physical Exam: ?Vital Signs ?There were no vitals taken for this visit. ? ?Physical Exam ?Constitutional:   ?   General: Not in acute distress. ?   Appearance: Normal appearance. Not ill-appearing.  ?HENT:  ?   Head: Normocephalic and atraumatic.  ?Eyes:  ?   Pupils: Pupils are equal, round. ?Cardiovascular:  ?   Rate and Rhythm: Normal rate. ?   Pulses: Normal pulses.  ?Pulmonary:  ?   Effort: No respiratory distress or increased work of breathing.  Speaks in full sentences. ?Abdominal:  ?   General: Abdomen is flat. No distension.   ?Musculoskeletal: Normal range of motion. No lower extremity swelling or edema. No varicosities. ?Skin: ?   General: Skin is warm and dry.  ?   Findings: No erythema or rash.  ?Neurological:  ?   Mental Status: Alert and oriented to person, place, and time.  ?Psychiatric:     ?   Mood and Affect: Mood normal.     ?   Behavior: Behavior normal.  ? ? ?Assessment/Plan: ?The patient is scheduled for bilateral breast reduction with possible liposuction with Dr. Ulice Boldillingham.  Risks, benefits, and alternatives of procedure discussed, questions answered and consent obtained.   ? ?Smoking Status: Non-smoker. ?Last Mammogram: N/A due to age ? ?Caprini Score: 3; Risk Factors include: BMI greater  than 25 and length of planned surgery. Recommendation for mechanical prophylaxis. Encourage early ambulation.  ? ?Pictures obtained: 12/18/2021 ? ?Post-op Rx sent to pharmacy: Norco, Zofran, Keflex. ? ?Patient was provided with the General Surgical Risk consent document and Pain Medication Agreement prior to their appointment.  They had adequate time to read through the risk consent documents and Pain Medication Agreement. We also discussed them in person together during this preop appointment. All of their questions were answered to their satisfaction.  Recommended calling if they have any further questions.  Risk consent form  and Pain Medication Agreement to be scanned into patient's chart. ? ?The risk that can be encountered with breast reduction were discussed and include the following but not limited to these:  Breast asymmetry, fluid accumulation, firmness of the breast, inability to breast feed, loss of nipple or areola, skin loss, decrease or no nipple sensation, fat necrosis of the breast tissue, bleeding, infection, healing delay.  There are risks of anesthesia, changes to skin sensation and injury to nerves or blood vessels.  The muscle can be temporarily or permanently injured.  You may have an allergic reaction to tape, suture, glue, blood products which can result in skin discoloration, swelling, pain, skin lesions, poor healing.  Any of these can lead to the need for revisonal surgery or stage procedures.  A reduction has potential to interfere with diagnostic procedures.  Nipple or breast piercing can increase risks of infection.  This procedure is best done when the breast is fully developed.  Changes in the breast will continue to occur over time.  Pregnancy can alter the outcomes of previous breast reduction surgery, weight gain and weigh loss can also effect the long term appearance.  ? ?The risks that can be encountered with and after liposuction were discussed and include the following but no  limited to these:  ?Asymmetry, fluid accumulation, firmness of the area, fat necrosis with death of fat tissue, bleeding, infection, delayed healing, anesthesia risks, skin sensation changes, injury to

## 2022-04-16 NOTE — H&P (View-Only) (Signed)
Patient ID: Donna Underwood, female    DOB: Aug 11, 1991, 31 y.o.   MRN: [id number]  Chief Complaint  Patient presents with   Pre-op Exam      ICD-10-CM   1. Symptomatic mammary hypertrophy  N62        History of Present Illness: Donna Underwood is a 31 y.o.  female  with a history of macromastia.  She presents for preoperative evaluation for upcoming procedure, bilateral breast reduction with possible liposuction, scheduled for 05/05/2022 with Dr. Ulice Bold.  The patient has not had problems with anesthesia.  Her only previous surgical history was getting tympanostomy tubes as a child.  She is not on OCPs or other contraceptive medications.  She does not smoke or use any other nicotine-containing products.  She confirms that she is a 34 F cup and would like to be a B or C cup.  She understands that we may not be able to take off as much as she would like in order to preserve the nipple and shape of the breast and is okay with that.  Denies any personal or family history of blood clots or clotting disorder.  No personal history of cancer, MI, or CVA.  She is not on any blood thinners.  She does have a history of exercise-induced asthma, but has not had to use her inhaler in over a year.  No history of keloiding.  Summary of Previous Visit: She was seen for consult on 12/18/2021.  At that time, complained of chronic upper back and neck discomfort in the context of large breasts.  STN 31 cm each side.  BMI equals 29.1 kg/m.  Preoperative bra size equals 34 F cup.  She expressed that she would like to be a B or C cup.  Estimated excess breast tissue removed at time of surgery equals 375 g each side.  Job: Works from home for [employer] as well as part-time with [employer].  Discussed 2 weeks FMLA.  PMH Significant for: Macromastia, allergic rhinitis, asthma, GERD.   Past Medical History: Allergies: Allergies  Allergen Reactions   Bactrim [Sulfamethoxazole-Trimethoprim] Hives    Ceclor [Cefaclor]     Does not know. Had reaction when she was child.    Current Medications:  Current Outpatient Medications:    albuterol (PROVENTIL HFA;VENTOLIN HFA) 108 (90 BASE) MCG/ACT inhaler, Inhale 2 puffs into the lungs every 6 (six) hours as needed for wheezing., Disp: 1 Inhaler, Rfl: 11   fluticasone (FLONASE) 50 MCG/ACT nasal spray, 1-2 sprays in each nostril, Disp: , Rfl:    ipratropium (ATROVENT) 0.06 % nasal spray, Place 2 sprays into both nostrils 4 (four) times daily. (Patient not taking: Reported on 12/18/2021), Disp: 15 mL, Rfl: 12   Iron, Ferrous Gluconate, 256 (28 FE) MG TABS, Take 1 tablet by mouth daily. (Patient not taking: Reported on 12/18/2021), Disp: 90 tablet, Rfl: 3   loratadine (CLARITIN) 10 MG tablet, Take 1 tablet (10 mg total) by mouth daily., Disp: 30 tablet, Rfl: 11   norgestimate-ethinyl estradiol (ORTHO-CYCLEN,SPRINTEC,PREVIFEM) 0.25-35 MG-MCG tablet, Take 1 tablet by mouth daily. (Patient not taking: Reported on 12/18/2021), Disp: 1 Package, Rfl: 11   omeprazole (PRILOSEC) 40 MG capsule, Take 1 capsule (40 mg total) by mouth daily., Disp: 30 capsule, Rfl: 0  Past Medical Problems: Past Medical History:  Diagnosis Date   Asthma    since childhood, exercise induced now    Past Surgical History: Past Surgical History:  Procedure Laterality Date  MYRINGOTOMY  2002    Social History: Social History   Socioeconomic History   Marital status: Single    Spouse name: Not on file   Number of children: Not on file   Years of education: 12    Highest education level: Not on file  Occupational History    Employer: [employer]   Occupation: Neurosurgeon: Advertising copywriter   Occupation: student    Comment: Loss adjuster, chartered for [employer]   Tobacco Use   Smoking status: Never   Smokeless tobacco: Never  Substance and Sexual Activity   Alcohol use: Yes    Alcohol/week: 1.0 standard drink    Types: 1 drink(s) per week    Comment: drank once.     Drug use: No   Sexual activity: Never    Birth control/protection: None  Other Topics Concern   Not on file  Social History Narrative   ** Merged History Encounter **       Lives with mom and sister.          Social Determinants of Health   Financial Resource Strain: Not on file  Food Insecurity: Not on file  Transportation Needs: Not on file  Physical Activity: Not on file  Stress: Not on file  Social Connections: Not on file  Intimate Partner Violence: Not on file    Family History: Family History  Problem Relation Age of Onset   Hypertension Mother    Eczema Sister     Review of Systems: ROS Denies any recent fevers, chest pain, difficulty breathing, or leg swelling.  Physical Exam: Vital Signs There were no vitals taken for this visit.  Physical Exam Constitutional:      General: Not in acute distress.    Appearance: Normal appearance. Not ill-appearing.  HENT:     Head: Normocephalic and atraumatic.  Eyes:     Pupils: Pupils are equal, round. Cardiovascular:     Rate and Rhythm: Normal rate.    Pulses: Normal pulses.  Pulmonary:     Effort: No respiratory distress or increased work of breathing.  Speaks in full sentences. Abdominal:     General: Abdomen is flat. No distension.   Musculoskeletal: Normal range of motion. No lower extremity swelling or edema. No varicosities. Skin:    General: Skin is warm and dry.     Findings: No erythema or rash.  Neurological:     Mental Status: Alert and oriented to person, place, and time.  Psychiatric:        Mood and Affect: Mood normal.        Behavior: Behavior normal.    Assessment/Plan: The patient is scheduled for bilateral breast reduction with possible liposuction with Dr. Ulice Bold.  Risks, benefits, and alternatives of procedure discussed, questions answered and consent obtained.    Smoking Status: Non-smoker. Last Mammogram: N/A due to age  Caprini Score: 3; Risk Factors include: BMI greater  than 25 and length of planned surgery. Recommendation for mechanical prophylaxis. Encourage early ambulation.   Pictures obtained: 12/18/2021  Post-op Rx sent to pharmacy: Norco, Zofran, Keflex.  Patient was provided with the General Surgical Risk consent document and Pain Medication Agreement prior to their appointment.  They had adequate time to read through the risk consent documents and Pain Medication Agreement. We also discussed them in person together during this preop appointment. All of their questions were answered to their satisfaction.  Recommended calling if they have any further questions.  Risk consent form  and Pain Medication Agreement to be scanned into patient's chart.  The risk that can be encountered with breast reduction were discussed and include the following but not limited to these:  Breast asymmetry, fluid accumulation, firmness of the breast, inability to breast feed, loss of nipple or areola, skin loss, decrease or no nipple sensation, fat necrosis of the breast tissue, bleeding, infection, healing delay.  There are risks of anesthesia, changes to skin sensation and injury to nerves or blood vessels.  The muscle can be temporarily or permanently injured.  You may have an allergic reaction to tape, suture, glue, blood products which can result in skin discoloration, swelling, pain, skin lesions, poor healing.  Any of these can lead to the need for revisonal surgery or stage procedures.  A reduction has potential to interfere with diagnostic procedures.  Nipple or breast piercing can increase risks of infection.  This procedure is best done when the breast is fully developed.  Changes in the breast will continue to occur over time.  Pregnancy can alter the outcomes of previous breast reduction surgery, weight gain and weigh loss can also effect the long term appearance.   The risks that can be encountered with and after liposuction were discussed and include the following but no  limited to these:  Asymmetry, fluid accumulation, firmness of the area, fat necrosis with death of fat tissue, bleeding, infection, delayed healing, anesthesia risks, skin sensation changes, injury to structures including nerves, blood vessels, and muscles which may be temporary or permanent, allergies to tape, suture materials and glues, blood products, topical preparations or injected agents, skin and contour irregularities, skin discoloration and swelling, deep vein thrombosis, cardiac and pulmonary complications, pain, which may persist, persistent pain, recurrence of the lesion, poor healing of the incision, possible need for revisional surgery or staged procedures. Thiere can also be persistent swelling, poor wound healing, rippling or loose skin, worsening of cellulite, swelling, and thermal burn or heat injury from ultrasound with the ultrasound-assisted lipoplasty technique. Any change in weight fluctuations can alter the outcome.    Electronically signed by: Evelena LeydenGarrett Lismary Kiehn, PA-C 04/16/2022 12:59 PM

## 2022-04-19 ENCOUNTER — Ambulatory Visit (INDEPENDENT_AMBULATORY_CARE_PROVIDER_SITE_OTHER): Payer: 59 | Admitting: Physician Assistant

## 2022-04-19 ENCOUNTER — Encounter: Payer: Self-pay | Admitting: Physician Assistant

## 2022-04-19 VITALS — BP 136/87 | HR 83 | Ht 62.0 in | Wt 158.4 lb

## 2022-04-19 DIAGNOSIS — N62 Hypertrophy of breast: Secondary | ICD-10-CM

## 2022-04-19 MED ORDER — CLINDAMYCIN HCL 150 MG PO CAPS: 450.0000 mg | ORAL_CAPSULE | Freq: Three times a day (TID) | ORAL | 0 refills | Status: AC

## 2022-04-19 MED ORDER — HYDROCODONE-ACETAMINOPHEN 5-325 MG PO TABS: 1.0000 | ORAL_TABLET | Freq: Four times a day (QID) | ORAL | 0 refills | Status: AC | PRN

## 2022-04-19 MED ORDER — ONDANSETRON 4 MG PO TBDP: 4.0000 mg | ORAL_TABLET | Freq: Three times a day (TID) | ORAL | 0 refills | Status: DC | PRN

## 2022-04-27 ENCOUNTER — Other Ambulatory Visit: Payer: Self-pay

## 2022-04-27 ENCOUNTER — Encounter (HOSPITAL_BASED_OUTPATIENT_CLINIC_OR_DEPARTMENT_OTHER): Payer: Self-pay | Admitting: Plastic Surgery

## 2022-05-05 ENCOUNTER — Encounter (HOSPITAL_BASED_OUTPATIENT_CLINIC_OR_DEPARTMENT_OTHER): Payer: Self-pay | Admitting: Plastic Surgery

## 2022-05-05 ENCOUNTER — Encounter (HOSPITAL_BASED_OUTPATIENT_CLINIC_OR_DEPARTMENT_OTHER)
Admission: RE | Disposition: A | Discharge status: Home / Self Care | Payer: Self-pay | Source: Home / Self Care | Attending: Plastic Surgery

## 2022-05-05 ENCOUNTER — Ambulatory Visit (HOSPITAL_BASED_OUTPATIENT_CLINIC_OR_DEPARTMENT_OTHER): Payer: 59 | Admitting: Anesthesiology

## 2022-05-05 ENCOUNTER — Other Ambulatory Visit: Payer: Self-pay

## 2022-05-05 ENCOUNTER — Ambulatory Visit (HOSPITAL_BASED_OUTPATIENT_CLINIC_OR_DEPARTMENT_OTHER)
Admission: RE | Admit: 2022-05-05 | Discharge: 2022-05-05 | Disposition: A | Discharge status: Home / Self Care | Payer: 59 | Attending: Plastic Surgery | Admitting: Plastic Surgery

## 2022-05-05 DIAGNOSIS — N62 Hypertrophy of breast: Secondary | ICD-10-CM | POA: Diagnosis present

## 2022-05-05 DIAGNOSIS — N6031 Fibrosclerosis of right breast: Secondary | ICD-10-CM | POA: Diagnosis not present

## 2022-05-05 DIAGNOSIS — K219 Gastro-esophageal reflux disease without esophagitis: Secondary | ICD-10-CM | POA: Diagnosis not present

## 2022-05-05 DIAGNOSIS — Z01818 Encounter for other preprocedural examination: Secondary | ICD-10-CM

## 2022-05-05 DIAGNOSIS — J45909 Unspecified asthma, uncomplicated: Secondary | ICD-10-CM | POA: Insufficient documentation

## 2022-05-05 DIAGNOSIS — M542 Cervicalgia: Secondary | ICD-10-CM

## 2022-05-05 DIAGNOSIS — M549 Dorsalgia, unspecified: Secondary | ICD-10-CM | POA: Diagnosis not present

## 2022-05-05 HISTORY — PX: BREAST REDUCTION SURGERY: SHX8

## 2022-05-05 LAB — POCT PREGNANCY, URINE: Preg Test, Ur: NEGATIVE

## 2022-05-05 SURGERY — BREAST REDUCTION WITH LIPOSUCTION
Anesthesia: General | Site: Breast | Laterality: Bilateral

## 2022-05-05 MED ORDER — CEFAZOLIN SODIUM-DEXTROSE 2-3 GM-%(50ML) IV SOLR
INTRAVENOUS | Status: DC | PRN
  Administered 2022-05-05: 2 g via INTRAVENOUS

## 2022-05-05 MED ORDER — ONDANSETRON HCL 4 MG/2ML IJ SOLN
INTRAMUSCULAR | Status: DC | PRN
  Administered 2022-05-05: 4 mg via INTRAVENOUS

## 2022-05-05 MED ORDER — PROPOFOL 10 MG/ML IV BOLUS
INTRAVENOUS | Status: AC
Start: 2022-05-05 — End: ?
  Filled 2022-05-05: qty 20

## 2022-05-05 MED ORDER — LIDOCAINE HCL 1 % IJ SOLN
INTRAVENOUS | Status: DC | PRN
  Administered 2022-05-05: 400 mL

## 2022-05-05 MED ORDER — FENTANYL CITRATE (PF) 100 MCG/2ML IJ SOLN
INTRAMUSCULAR | Status: AC
  Filled 2022-05-05: qty 2

## 2022-05-05 MED ORDER — FENTANYL CITRATE (PF) 100 MCG/2ML IJ SOLN
INTRAMUSCULAR | Status: AC
Start: 2022-05-05 — End: ?
  Filled 2022-05-05: qty 2

## 2022-05-05 MED ORDER — 0.9 % SODIUM CHLORIDE (POUR BTL) OPTIME
TOPICAL | Status: DC | PRN
Start: 2022-05-05 — End: 2022-05-05

## 2022-05-05 MED ORDER — SODIUM CHLORIDE 0.9 % IV SOLN: 250.0000 mL | INTRAVENOUS | Status: DC | PRN

## 2022-05-05 MED ORDER — MIDAZOLAM HCL 5 MG/5ML IJ SOLN
INTRAMUSCULAR | Status: DC | PRN
  Administered 2022-05-05 (×2): 1 mg via INTRAVENOUS

## 2022-05-05 MED ORDER — PROPOFOL 10 MG/ML IV BOLUS
INTRAVENOUS | Status: AC
  Filled 2022-05-05: qty 20

## 2022-05-05 MED ORDER — CEFAZOLIN SODIUM-DEXTROSE 2-4 GM/100ML-% IV SOLN
INTRAVENOUS | Status: AC
  Filled 2022-05-05: qty 100

## 2022-05-05 MED ORDER — SODIUM CHLORIDE 0.9% FLUSH: 3.0000 mL | Freq: Two times a day (BID) | INTRAVENOUS | Status: DC

## 2022-05-05 MED ORDER — LIDOCAINE-EPINEPHRINE 1 %-1:100000 IJ SOLN
INTRAMUSCULAR | Status: DC | PRN
  Administered 2022-05-05: 39 mL

## 2022-05-05 MED ORDER — EPHEDRINE SULFATE (PRESSORS) 50 MG/ML IJ SOLN
INTRAMUSCULAR | Status: DC | PRN
  Administered 2022-05-05: 10 mg via INTRAVENOUS

## 2022-05-05 MED ORDER — MIDAZOLAM HCL 2 MG/2ML IJ SOLN
INTRAMUSCULAR | Status: AC
  Filled 2022-05-05: qty 2

## 2022-05-05 MED ORDER — FENTANYL CITRATE (PF) 100 MCG/2ML IJ SOLN: 25.0000 ug | INTRAMUSCULAR | Status: DC | PRN

## 2022-05-05 MED ORDER — SODIUM CHLORIDE 0.9% FLUSH: 3.0000 mL | INTRAVENOUS | Status: DC | PRN

## 2022-05-05 MED ORDER — ACETAMINOPHEN 325 MG RE SUPP: 650.0000 mg | RECTAL | Status: DC | PRN

## 2022-05-05 MED ORDER — OXYCODONE HCL 5 MG PO TABS: 5.0000 mg | ORAL_TABLET | Freq: Once | ORAL | Status: DC | PRN

## 2022-05-05 MED ORDER — ONDANSETRON HCL 4 MG/2ML IJ SOLN
INTRAMUSCULAR | Status: AC
  Filled 2022-05-05: qty 2

## 2022-05-05 MED ORDER — OXYCODONE HCL 5 MG/5ML PO SOLN: 5.0000 mg | Freq: Once | ORAL | Status: DC | PRN

## 2022-05-05 MED ORDER — LACTATED RINGERS IV SOLN: INTRAVENOUS | Status: DC

## 2022-05-05 MED ORDER — FENTANYL CITRATE (PF) 100 MCG/2ML IJ SOLN
INTRAMUSCULAR | Status: DC | PRN
Start: 2022-05-05 — End: 2022-05-05
  Administered 2022-05-05: 50 ug via INTRAVENOUS
  Administered 2022-05-05 (×3): 25 ug via INTRAVENOUS
  Administered 2022-05-05: 50 ug via INTRAVENOUS
  Administered 2022-05-05: 25 ug via INTRAVENOUS
  Administered 2022-05-05: 50 ug via INTRAVENOUS
  Administered 2022-05-05: 25 ug via INTRAVENOUS

## 2022-05-05 MED ORDER — DEXAMETHASONE SODIUM PHOSPHATE 10 MG/ML IJ SOLN
INTRAMUSCULAR | Status: DC | PRN
  Administered 2022-05-05: 5 mg via INTRAVENOUS

## 2022-05-05 MED ORDER — ACETAMINOPHEN 325 MG PO TABS: 650.0000 mg | ORAL_TABLET | ORAL | Status: DC | PRN

## 2022-05-05 MED ORDER — CHLORHEXIDINE GLUCONATE CLOTH 2 % EX PADS: 6.0000 | MEDICATED_PAD | Freq: Once | CUTANEOUS | Status: DC

## 2022-05-05 MED ORDER — OXYCODONE HCL 5 MG PO TABS: 5.0000 mg | ORAL_TABLET | ORAL | Status: DC | PRN

## 2022-05-05 MED ORDER — LIDOCAINE HCL 1 % IJ SOLN
INTRAMUSCULAR | Status: DC | PRN
Start: 2022-05-05 — End: 2022-05-05
  Administered 2022-05-05: 50 mg via INTRADERMAL

## 2022-05-05 MED ORDER — PROPOFOL 10 MG/ML IV BOLUS
INTRAVENOUS | Status: DC | PRN
  Administered 2022-05-05 (×2): 20 mg via INTRAVENOUS
  Administered 2022-05-05: 30 mg via INTRAVENOUS
  Administered 2022-05-05: 200 mg via INTRAVENOUS

## 2022-05-05 MED ORDER — ONDANSETRON HCL 4 MG/2ML IJ SOLN: 4.0000 mg | Freq: Four times a day (QID) | INTRAMUSCULAR | Status: DC | PRN

## 2022-05-05 MED ORDER — FENTANYL CITRATE (PF) 100 MCG/2ML IJ SOLN
25.0000 ug | INTRAMUSCULAR | Status: DC | PRN
  Administered 2022-05-05 (×2): 50 ug via INTRAVENOUS

## 2022-05-05 SURGICAL SUPPLY — 70 items
ADH SKN CLS APL DERMABOND .7 (GAUZE/BANDAGES/DRESSINGS) ×2
BAG DECANTER FOR FLEXI CONT (MISCELLANEOUS) ×1 IMPLANT
BINDER BREAST LRG (GAUZE/BANDAGES/DRESSINGS) IMPLANT
BINDER BREAST MEDIUM (GAUZE/BANDAGES/DRESSINGS) IMPLANT
BINDER BREAST XLRG (GAUZE/BANDAGES/DRESSINGS) IMPLANT
BINDER BREAST XXLRG (GAUZE/BANDAGES/DRESSINGS) IMPLANT
BIOPATCH RED 1 DISK 7.0 (GAUZE/BANDAGES/DRESSINGS) IMPLANT
BLADE HEX COATED 2.75 (ELECTRODE) ×2 IMPLANT
BLADE KNIFE PERSONA 10 (BLADE) ×4 IMPLANT
BLADE SURG 15 STRL LF DISP TIS (BLADE) IMPLANT
BLADE SURG 15 STRL SS (BLADE)
CANISTER SUCT 1200ML W/VALVE (MISCELLANEOUS) ×2 IMPLANT
COVER BACK TABLE 60X90IN (DRAPES) ×2 IMPLANT
COVER MAYO STAND STRL (DRAPES) ×2 IMPLANT
DERMABOND ADVANCED (GAUZE/BANDAGES/DRESSINGS) ×2
DERMABOND ADVANCED .7 DNX12 (GAUZE/BANDAGES/DRESSINGS) ×2 IMPLANT
DRAIN CHANNEL 19F RND (DRAIN) IMPLANT
DRAPE LAPAROSCOPIC ABDOMINAL (DRAPES) ×2 IMPLANT
DRSG OPSITE POSTOP 4X12 (GAUZE/BANDAGES/DRESSINGS) IMPLANT
DRSG OPSITE POSTOP 4X6 (GAUZE/BANDAGES/DRESSINGS) IMPLANT
DRSG PAD ABDOMINAL 8X10 ST (GAUZE/BANDAGES/DRESSINGS) ×4 IMPLANT
ELECT BLADE 4.0 EZ CLEAN MEGAD (MISCELLANEOUS) ×2
ELECT REM PT RETURN 9FT ADLT (ELECTROSURGICAL) ×2
ELECTRODE BLDE 4.0 EZ CLN MEGD (MISCELLANEOUS) ×1 IMPLANT
ELECTRODE REM PT RTRN 9FT ADLT (ELECTROSURGICAL) ×1 IMPLANT
EVACUATOR SILICONE 100CC (DRAIN) IMPLANT
GAUZE SPONGE 4X4 12PLY STRL LF (GAUZE/BANDAGES/DRESSINGS) IMPLANT
GLOVE BIO SURGEON STRL SZ 6.5 (GLOVE) ×6 IMPLANT
GLOVE BIOGEL M STRL SZ7.5 (GLOVE) IMPLANT
GLOVE SURG SS PI 7.0 STRL IVOR (GLOVE) ×1 IMPLANT
GOWN STRL REUS W/ TWL LRG LVL3 (GOWN DISPOSABLE) ×2 IMPLANT
GOWN STRL REUS W/ TWL XL LVL3 (GOWN DISPOSABLE) ×1 IMPLANT
GOWN STRL REUS W/TWL LRG LVL3 (GOWN DISPOSABLE) ×6
GOWN STRL REUS W/TWL XL LVL3 (GOWN DISPOSABLE) ×2
NDL FILTER BLUNT 18X1 1/2 (NEEDLE) ×1 IMPLANT
NDL HYPO 25X1 1.5 SAFETY (NEEDLE) ×1 IMPLANT
NDL SAFETY ECLIPSE 18X1.5 (NEEDLE) IMPLANT
NEEDLE FILTER BLUNT 18X 1/2SAF (NEEDLE) ×1
NEEDLE FILTER BLUNT 18X1 1/2 (NEEDLE) ×1 IMPLANT
NEEDLE HYPO 18GX1.5 SHARP (NEEDLE)
NEEDLE HYPO 25X1 1.5 SAFETY (NEEDLE) ×2 IMPLANT
NS IRRIG 1000ML POUR BTL (IV SOLUTION) ×1 IMPLANT
PACK BASIN DAY SURGERY FS (CUSTOM PROCEDURE TRAY) ×2 IMPLANT
PAD ALCOHOL SWAB (MISCELLANEOUS) ×1 IMPLANT
PAD FOAM SILICONE BACKED (GAUZE/BANDAGES/DRESSINGS) IMPLANT
PENCIL SMOKE EVACUATOR (MISCELLANEOUS) ×2 IMPLANT
PIN SAFETY STERILE (MISCELLANEOUS) IMPLANT
SLEEVE SCD COMPRESS KNEE MED (STOCKING) ×2 IMPLANT
SPIKE FLUID TRANSFER (MISCELLANEOUS) IMPLANT
SPONGE T-LAP 18X18 ~~LOC~~+RFID (SPONGE) ×5 IMPLANT
STRIP SUTURE WOUND CLOSURE 1/2 (MISCELLANEOUS) ×4 IMPLANT
SUT MNCRL AB 4-0 PS2 18 (SUTURE) ×12 IMPLANT
SUT MON AB 3-0 SH 27 (SUTURE) ×14
SUT MON AB 3-0 SH27 (SUTURE) ×4 IMPLANT
SUT MON AB 5-0 PS2 18 (SUTURE) ×1 IMPLANT
SUT PDS 3-0 CT2 (SUTURE) ×12
SUT PDS II 3-0 CT2 27 ABS (SUTURE) ×6 IMPLANT
SUT SILK 3 0 PS 1 (SUTURE) IMPLANT
SYR 3ML 23GX1 SAFETY (SYRINGE) ×1 IMPLANT
SYR 50ML LL SCALE MARK (SYRINGE) ×1 IMPLANT
SYR BULB IRRIG 60ML STRL (SYRINGE) ×2 IMPLANT
SYR CONTROL 10ML LL (SYRINGE) ×2 IMPLANT
TAPE MEASURE VINYL STERILE (MISCELLANEOUS) IMPLANT
TOWEL GREEN STERILE FF (TOWEL DISPOSABLE) ×4 IMPLANT
TRAY DSU PREP LF (CUSTOM PROCEDURE TRAY) ×2 IMPLANT
TUBE CONNECTING 20X1/4 (TUBING) ×2 IMPLANT
TUBING INFILTRATION IT-10001 (TUBING) ×1 IMPLANT
TUBING SET GRADUATE ASPIR 12FT (MISCELLANEOUS) ×1 IMPLANT
UNDERPAD 30X36 HEAVY ABSORB (UNDERPADS AND DIAPERS) ×4 IMPLANT
YANKAUER SUCT BULB TIP NO VENT (SUCTIONS) ×2 IMPLANT

## 2022-05-05 NOTE — Anesthesia Preprocedure Evaluation (Signed)
Anesthesia Evaluation  Patient identified by MRN, date of birth, ID band Patient awake    Reviewed: Allergy & Precautions, H&P , NPO status , Patient's Chart, lab work & pertinent test results  Airway Mallampati: II   Neck ROM: full    Dental   Pulmonary asthma ,    breath sounds clear to auscultation       Cardiovascular negative cardio ROS   Rhythm:regular Rate:Normal     Neuro/Psych    GI/Hepatic   Endo/Other    Renal/GU      Musculoskeletal   Abdominal   Peds  Hematology   Anesthesia Other Findings   Reproductive/Obstetrics                             Anesthesia Physical Anesthesia Plan  ASA: 2  Anesthesia Plan: General   Post-op Pain Management:    Induction: Intravenous  PONV Risk Score and Plan: 3 and Ondansetron, Dexamethasone, Midazolam and Treatment may vary due to age or medical condition  Airway Management Planned: Oral ETT  Additional Equipment:   Intra-op Plan:   Post-operative Plan: Extubation in OR  Informed Consent: I have reviewed the patients History and Physical, chart, labs and discussed the procedure including the risks, benefits and alternatives for the proposed anesthesia with the patient or authorized representative who has indicated his/her understanding and acceptance.     Dental advisory given  Plan Discussed with: CRNA, Anesthesiologist and Surgeon  Anesthesia Plan Comments:         Anesthesia Quick Evaluation

## 2022-05-05 NOTE — Interval H&P Note (Signed)
History and Physical Interval Note:  05/05/2022 9:24 AM  Donna Underwood  has presented today for surgery, with the diagnosis of Symptomatic mammary hypertrophy.  The various methods of treatment have been discussed with the patient and family. After consideration of risks, benefits and other options for treatment, the patient has consented to  Procedure(s): MAMMARY REDUCTION  (BREAST) (Bilateral) as a surgical intervention.  The patient's history has been reviewed, patient examined, no change in status, stable for surgery.  I have reviewed the patient's chart and labs.  Questions were answered to the patient's satisfaction.     Alena Bills Colby Reels

## 2022-05-05 NOTE — Anesthesia Postprocedure Evaluation (Signed)
Anesthesia Post Note  Patient: Donna Underwood  Procedure(s) Performed: BREAST REDUCTION WITH LIPOSUCTION (Bilateral: Breast)     Patient location during evaluation: PACU Anesthesia Type: General Level of consciousness: awake and alert Pain management: pain level controlled Vital Signs Assessment: post-procedure vital signs reviewed and stable Respiratory status: spontaneous breathing, nonlabored ventilation, respiratory function stable and patient connected to nasal cannula oxygen Cardiovascular status: blood pressure returned to baseline and stable Postop Assessment: no apparent nausea or vomiting Anesthetic complications: no   No notable events documented.  Last Vitals:  Vitals:   05/05/22 1245 05/05/22 1330  BP: 127/86 (!) 140/91  Pulse: 88 77  Resp: 15 20  Temp:  36.9 C  SpO2: 100% 100%    Last Pain:  Vitals:   05/05/22 1330  TempSrc: Oral  PainSc: 3                  Jannet Calip S

## 2022-05-05 NOTE — Anesthesia Procedure Notes (Signed)
Procedure Name: LMA Insertion Date/Time: 05/05/2022 10:08 AM Performed by: Garth Bigness, CRNA Pre-anesthesia Checklist: Patient identified, Emergency Drugs available, Suction available and Patient being monitored Patient Re-evaluated:Patient Re-evaluated prior to induction Oxygen Delivery Method: Circle system utilized Preoxygenation: Pre-oxygenation with 100% oxygen Induction Type: IV induction Ventilation: Mask ventilation without difficulty LMA: LMA inserted LMA Size: 4.0 Number of attempts: 1 Placement Confirmation: positive ETCO2 Tube secured with: Tape Dental Injury: Teeth and Oropharynx as per pre-operative assessment

## 2022-05-05 NOTE — Op Note (Addendum)
Breast Reduction Op note:    DATE OF PROCEDURE: 05/05/2022  LOCATION: [facility]  SURGEON: [facility] Lakeyia Surber, DO  ASSISTANT: Caroline More, PA and Evelena Leyden, [place]  PREOPERATIVE DIAGNOSIS 1. Macromastia 2. Neck Pain 3. Back Pain  POSTOPERATIVE DIAGNOSIS 1. Macromastia 2. Neck Pain 3. Back Pain  PROCEDURES 1. Bilateral breast reduction.  Right reduction 375 gm, Left reduction 390 g  COMPLICATIONS: None.  DRAINS: none  INDICATIONS FOR PROCEDURE Donna Underwood is a 31 y.o. year-old female born on 07-26-1991,with a history of symptomatic macromastia with concominant back pain, neck pain, shoulder grooving from her bra.   MRN: [id number]  CONSENT Informed consent was obtained directly from the patient. The risks, benefits and alternatives were fully discussed. Specific risks including but not limited to bleeding, infection, hematoma, seroma, scarring, pain, nipple necrosis, asymmetry, poor cosmetic results, and need for further surgery were discussed. The patient had ample opportunity to have her questions answered to her satisfaction.  DESCRIPTION OF PROCEDURE  Patient was brought into the operating room and placed in a supine position.  SCDs were placed and appropriate padding was performed.  Antibiotics were given. The patient underwent general anesthesia and the chest was prepped and draped in a sterile fashion.  A timeout was performed and all information was confirmed to be correct.  Right side: Preoperative markings were confirmed.  Incision lines were injected with local with epinephrine.  After waiting for vasoconstriction, the marked lines were incised.  A Wise-pattern superomedial breast reduction was performed by de-epithelializing the pedicle, using bovie to create the superomedial pedicle, and removing breast tissue from the superior, lateral, and inferior portions of the breast.  Care was taken to not undermine the breast pedicle.  Hemostasis was achieved.  The nipple was gently rotated into position and the soft tissue closed with 4-0 Monocryl.   The pocket was irrigated and hemostasis confirmed.  The deep tissues were approximated with 3-0 PDS sutures and the skin was closed with deep dermal and subcuticular 4-0 Monocryl sutures.  The nipple and skin flaps had good capillary refill at the end of the procedure.    Left side: Preoperative markings were confirmed.  Incision lines were injected with local with epinephrine.  After waiting for vasoconstriction, the marked lines were incised.  A Wise-pattern superomedial breast reduction was performed by de-epithelializing the pedicle, using bovie to create the superomedial pedicle, and removing breast tissue from the superior, lateral, and inferior portions of the breast.  Care was taken to not undermine the breast pedicle. Hemostasis was achieved.  The nipple was gently rotated into position and the soft tissue was closed with 4-0 Monocryl.  The patient was sat upright and size and shape symmetry was confirmed.  The pocket was irrigated and hemostasis confirmed.  The deep tissues were approximated with 3-0 PDS sutures and the skin was closed with deep dermal and subcuticular 4-0 Monocryl sutures.  Dermabond was applied.  A breast binder and ABDs were placed.  The nipple and skin flaps had good capillary refill at the end of the procedure.  The patient tolerated the procedure well. The patient was allowed to wake from anesthesia and taken to the recovery room in satisfactory condition.  The advanced practice practitioner (APP), Caroline More, assisted throughout the case. She was essential in retraction and counter traction when needed to make the case progress smoothly.  This retraction and assistance made it possible to see the tissue plans for the procedure.  The assistance was  needed for blood control, tissue re-approximation and assisted with closure of the incision site.

## 2022-05-05 NOTE — Transfer of Care (Signed)
Immediate Anesthesia Transfer of Care Note  Patient: Donna Underwood  Procedure(s) Performed: BREAST REDUCTION WITH LIPOSUCTION (Bilateral: Breast)  Patient Location: PACU  Anesthesia Type:General  Level of Consciousness: awake, alert , oriented and patient cooperative  Airway & Oxygen Therapy: Patient Spontanous Breathing and Patient connected to face mask oxygen  Post-op Assessment: Report given to RN and Post -op Vital signs reviewed and stable  Post vital signs: Reviewed and stable  Last Vitals:  Vitals Value Taken Time  BP    Temp    Pulse    Resp    SpO2      Last Pain:  Vitals:   05/05/22 0800  TempSrc: Oral  PainSc: 0-No pain      Patients Stated Pain Goal: 8 (05/05/22 0800)  Complications: No notable events documented.

## 2022-05-05 NOTE — Discharge Instructions (Addendum)
INSTRUCTIONS FOR AFTER BREAST SURGERY   You will likely have some questions about what to expect following your operation.  The following information will help you and your family understand what to expect when you are discharged from the hospital.  Following these guidelines will help ensure a smooth recovery and reduce risks of complications.  Postoperative instructions include information on: diet, wound care, medications and physical activity.  AFTER SURGERY Expect to go home after the procedure.  In some cases, you may need to spend one night in the hospital for observation.  DIET Breast surgery does not require a specific diet.  However, the healthier you eat the better your body can start healing. It is important to increasing your protein intake.  This means limiting the foods with sugar and carbohydrates.  Focus on vegetables and some meat.  If you have any liposuction during your procedure be sure to drink water.  If your urine is bright yellow, then it is concentrated, and you need to drink more water.  As a general rule after surgery, you should have 8 ounces of water every hour while awake.  If you find you are persistently nauseated or unable to take in liquids let us know.  NO TOBACCO USE or EXPOSURE.  This will slow your healing process and increase the risk of a wound.  WOUND CARE Leave the ACE wrap or binder on for 3 days . Use fragrance free soap.   After 3 days you can remove the ACE wrap or binder to shower. Once dry apply ACE wrap, binder or sports bra.  Use a mild soap like Dial, Dove and Ivory. You may have Topifoam or Lipofoam on.  It is soft and spongy and helps keep you from getting creases if you have liposuction.  This can be removed before the shower and then replaced.  If you need more it is available on [employer] (Lipofoam). If you have steri-strips / tape directly attached to your skin leave them in place. It is OK to get these wet.   No baths, pools or hot tubs for four  weeks. We close your incision to leave the smallest and best-looking scar. No ointment or creams on your incisions until given the go ahead.  Especially not Neosporin (Too many skin reactions with this one).  A few weeks after surgery you can use Mederma and start massaging the scar. We ask you to wear your binder or sports bra for the first 6 weeks around the clock, including while sleeping. This provides added comfort and helps reduce the fluid accumulation at the surgery site.  ACTIVITY No heavy lifting until cleared by the doctor.  This usually means no more than a half-gallon of milk.  It is OK to walk and climb stairs. In fact, moving your legs is very important to decrease your risk of a blood clot.  It will also help keep you from getting deconditioned.  Every 1 to 2 hours get up and walk for 5 minutes. This will help with a quicker recovery back to normal.  Let pain be your guide so you don't do too much.  This is not the time for spring cleaning and don't plan on taking care of anyone else.  This time is for you to recover,  You will be more comfortable if you sleep and rest with your head elevated either with a few pillows under you or in a recliner.  No stomach sleeping for a three months.  WORK Everyone   returns to work at different times. As a rough guide, most people take at least 1 - 2 weeks off prior to returning to work. If you need documentation for your job, bring the forms to your postoperative follow up visit.  DRIVING Arrange for someone to bring you home from the hospital.  You may be able to drive a few days after surgery but not while taking any narcotics or valium.  BOWEL MOVEMENTS Constipation can occur after anesthesia and while taking pain medication.  It is important to stay ahead for your comfort.  We recommend taking Milk of Magnesia (2 tablespoons; twice a day) while taking the pain pills.  MEDICATIONS You may be prescribed should start after surgery At your  preoperative visit for you history and physical you may have been given the following medications: An antibiotic: Start this medication when you get home and take according to the instructions on the bottle. Zofran 4 mg:  This is to treat nausea and vomiting.  You can take this every 6 hours as needed and only if needed. Valium 2 mg: This is for muscle tightness if you have an implant or expander. This will help relax your muscle which also helps with pain control.  This can be taken every 12 hours as needed. Don't drive after taking this medication. Norco (hydrocodone/acetaminophen) 5/325 mg:  This is only to be used after you have taken the motrin or the tylenol. Every 8 hours as needed.   Over the counter Medication to take: Ibuprofen (Motrin) 600 mg:  Take this every 6 hours.  If you have additional pain then take 500 mg of the tylenol every 8 hours.  Only take the Norco after you have tried these two. Miralax or stool softener of choice: Take this according to the bottle if you take the Norco.  WHEN TO CALL Call your surgeon's office if any of the following occur: Fever 101 degrees F or greater Excessive bleeding or fluid from the incision site. Pain that increases over time without aid from the medications Redness, warmth, or pus draining from incision sites Persistent nausea or inability to take in liquids Severe misshapen area that underwent the operation.   Post Anesthesia Home Care Instructions  Activity: Get plenty of rest for the remainder of the day. A responsible individual must stay with you for 24 hours following the procedure.  For the next 24 hours, DO NOT: -Drive a car -Operate machinery -Drink alcoholic beverages -Take any medication unless instructed by your physician -Make any legal decisions or sign important papers.  Meals: Start with liquid foods such as gelatin or soup. Progress to regular foods as tolerated. Avoid greasy, spicy, heavy foods. If nausea  and/or vomiting occur, drink only clear liquids until the nausea and/or vomiting subsides. Call your physician if vomiting continues.  Special Instructions/Symptoms: Your throat may feel dry or sore from the anesthesia or the breathing tube placed in your throat during surgery. If this causes discomfort, gargle with warm salt water. The discomfort should disappear within 24 hours.  If you had a scopolamine patch placed behind your ear for the management of post- operative nausea and/or vomiting:  1. The medication in the patch is effective for 72 hours, after which it should be removed.  Wrap patch in a tissue and discard in the trash. Wash hands thoroughly with soap and water. 2. You may remove the patch earlier than 72 hours if you experience unpleasant side effects which may include dry mouth, dizziness   or visual disturbances. 3. Avoid touching the patch. Wash your hands with soap and water after contact with the patch.     

## 2022-05-06 ENCOUNTER — Encounter (HOSPITAL_BASED_OUTPATIENT_CLINIC_OR_DEPARTMENT_OTHER): Payer: Self-pay | Admitting: Plastic Surgery

## 2022-05-06 LAB — SURGICAL PATHOLOGY

## 2022-05-18 ENCOUNTER — Encounter: Payer: Self-pay | Admitting: Plastic Surgery

## 2022-05-18 ENCOUNTER — Ambulatory Visit (INDEPENDENT_AMBULATORY_CARE_PROVIDER_SITE_OTHER): Payer: 59 | Admitting: Plastic Surgery

## 2022-05-18 DIAGNOSIS — N62 Hypertrophy of breast: Secondary | ICD-10-CM

## 2022-05-18 NOTE — Progress Notes (Signed)
The patient is a 31 year old for follow-up breast reduction from a week ago.  She has a little bit of a rash at the inframammary fold.  I think this is because she has been scared to lift her breast up so its been staying very moist.  The Steri-Strips are in place.  There is no sign of a hematoma or seroma.  Her nipple areolas have good capillary refill.  Her pain is well controlled.  She is very pleased with her results.  She had over 300 g removed from each breast. Pictures were obtained of the patient and placed in the chart with the patient's or guardian's permission.

## 2022-05-23 ENCOUNTER — Encounter: Payer: Self-pay | Admitting: Plastic Surgery

## 2022-05-25 ENCOUNTER — Encounter: Payer: 59 | Admitting: Student

## 2022-05-27 ENCOUNTER — Telehealth: Payer: Self-pay | Admitting: Plastic Surgery

## 2022-05-27 ENCOUNTER — Telehealth: Payer: Self-pay | Admitting: Student

## 2022-05-27 NOTE — Telephone Encounter (Signed)
Spoke with the patient on the phone regarding her MyChart message.  Patient reports she feels like there may be some irritation under some of the Steri-Strips.  She states it has not red at this time but states she feels it may become more irritated.  I instructed the patient that she may cut the Steri-Strips where she feels it is irritated.  Discussed with patient to use caution while cutting the Steri-Strips.  Discussed with patient that if she feels the area is still irritated after cutting the Steri-Strips, she may take the Steri-Strips off gently.  Patient denies any other issues at this time.  Plan to move patient's appointment from next Friday to Tuesday.

## 2022-05-27 NOTE — Telephone Encounter (Signed)
Patient called to report irritation from steri-strips; requesting call back to advise whether to keep them on until the next appointment on 6/23 or to remove them now. Call transferred to clinic; advised patient I would also enter note in her chart as a follow up.  Please advise at [phone].

## 2022-05-28 NOTE — Telephone Encounter (Signed)
Closing note-claire, PA spoke to pt.

## 2022-05-31 NOTE — Progress Notes (Unsigned)
Patient is a 31 year old female with history of macromastia.  She underwent bilateral breast reduction with Dr. Ulice Bold on 05/05/2022.  Patient presents for postoperative follow-up.  Patient was last seen in the clinic on 05/18/2022.  At this visit, it was found patient had a slight rash at the inframammary fold.  There is no sign of hematoma or seroma.  NAC's are viable bilaterally.  Since patient's last visit, patient called the office because she was having some irritation underneath some of the Steri-Strips.  Discussed with patient that she may cut the Steri-Strips where she feels the skin is getting irritated and she may remove them if it is still irritated after cutting the Steri-Strips.  Today,

## 2022-06-01 ENCOUNTER — Ambulatory Visit (INDEPENDENT_AMBULATORY_CARE_PROVIDER_SITE_OTHER): Payer: 59 | Admitting: Student

## 2022-06-01 DIAGNOSIS — N62 Hypertrophy of breast: Secondary | ICD-10-CM

## 2022-06-04 ENCOUNTER — Encounter: Payer: 59 | Admitting: Physician Assistant

## 2022-06-16 NOTE — Progress Notes (Signed)
Patient is a 31 year old female with history of macromastia.  She underwent bilateral breast reduction with Dr. Ulice Bold on 05/05/2022.  Patient presents for postoperative follow-up.  Patient was last seen in the clinic on 06/01/2022.  At this visit, patient reported she was doing well.  She did complain of some possible irritation near where the Steri-Strips were.  On exam, breasts were fairly symmetrical and soft.  There was some minor swelling noted.  NAC's were viable bilaterally.  Incisions were healing well.  Today, patient reports she is doing really well.  Overall, she is very happy with her results.  She states she occasionally has some aching pain and sometimes burning pain, but she states that this has improved since she has had surgery.  She denies any issues with the surgical site.  She denies any fevers or chills.  Chaperone present on exam.  On exam, patient sitting upright in no acute distress.  Breasts are fairly symmetrical and soft.  NAC's appear viable bilaterally.  There are some small areas of firmness to the inferior aspect of the breasts bilaterally.  This seems consistent with fat necrosis.  Incisions are healing well.  There does appear to be a small area of dryness to the right NAC.  There is no erythema.  There is no drainage from the incisions.  There appears to be some residual Dermabond to the skin surrounding the NAC's bilaterally.  Discussed with patient she can put Vaseline on the areas of dryness and over the residual Dermabond.  Discussed with patient that she can gradually increase her activities.  Discussed with patient that she can transition into a regular bra without underwire.  Discussed with patient to avoid submerging the incisions for another few weeks.  Discussed with patient she may gently massage the areas of firmness.  Instructed patient to call us if these do not go away in a few months.  Discussed with patient that the burning sensations and aching pain  should improve with time.  Discussed with patient that she may start using scar creams such as Mederma or skinuva to the incisions.  Patient may follow-up as needed.  Discussed with patient to call if she has any questions or concerns.  Pictures were obtained of the patient and placed in the patient's chart with the patient's permission.

## 2022-06-18 ENCOUNTER — Ambulatory Visit (INDEPENDENT_AMBULATORY_CARE_PROVIDER_SITE_OTHER): Payer: 59 | Admitting: Student

## 2022-06-18 DIAGNOSIS — N62 Hypertrophy of breast: Secondary | ICD-10-CM

## 2022-11-08 ENCOUNTER — Ambulatory Visit (INDEPENDENT_AMBULATORY_CARE_PROVIDER_SITE_OTHER): Payer: 59 | Admitting: Student

## 2022-11-08 DIAGNOSIS — N62 Hypertrophy of breast: Secondary | ICD-10-CM | POA: Diagnosis not present

## 2022-11-08 NOTE — Progress Notes (Unsigned)
   Referring Provider Merri Brunette, MD [phone] [address] [place],  [place] [address]   CC:  Chief Complaint  Patient presents with   Follow-up      Donna Underwood is an 31 y.o. female.  HPI: Patient is a 31 year old female who underwent bilateral breast reduction with Dr. Ulice Bold on 05/05/2022.  Patient presents to the clinic today for 57-month follow-up.  Today, patient reports she is doing well.  She states that she is overall very happy with her breast reduction and her results.  Patient reports that since she was last seen in the clinic, her fat necrosis has definitely softened up and she believes it is not present anymore.  Patient states that since surgery, she feels breast soreness the week leading up to her period.  She states that when her period begins, her breast soreness goes away.  She reports that she did not experience this prior to her breast reduction surgery.  Patient denies any other issues or complaints. Patient reports she has scar creams but has not been applying the scar cream. She states that she is not bothered by her scars.   Review of Systems General: Denies fevers or chills  Physical Exam    05/05/2022    1:30 PM 05/05/2022   12:45 PM 05/05/2022   12:13 PM  Vitals with BMI  Systolic 140 127 409  Diastolic 91 86 93  Pulse 77 88 118    General:  No acute distress,  Alert and oriented, Non-Toxic, Normal speech and affect Chaperone present on exam.  On exam, patient is sitting upright in no acute distress.  Breasts are symmetric and soft bilaterally.  There is no overlying erythema.  There is no fat necrosis palpated on exam.  NAC's appear viable bilaterally.  Incisions appear to be healing well.  Assessment/Plan  Symptomatic mammary hypertrophy   I discussed with the patient that her symptoms of breast soreness seem to be more correlated with her menstrual cycle rather than a postsurgical issue. I recommended the patient follow up with her GYN  or PCP regarding this.   I discussed with the patient that she appears to be doing well from a surgical standpoint. I discussed with her that she can follow up as needed.   I instructed the patient to call in the meantime if she has any questions or concerns.   Pictures were obtained of the patient and placed in the chart with the patient's or guardian's permission.   Laurena Spies 11/09/2022, 7:52 AM

## 2022-11-12 ENCOUNTER — Other Ambulatory Visit: Payer: Self-pay | Admitting: Obstetrics and Gynecology

## 2022-11-12 ENCOUNTER — Other Ambulatory Visit (HOSPITAL_COMMUNITY)
Admission: RE | Admit: 2022-11-12 | Discharge: 2022-11-12 | Disposition: A | Discharge status: Home / Self Care | Payer: 59 | Source: Ambulatory Visit | Attending: Obstetrics and Gynecology | Admitting: Obstetrics and Gynecology

## 2022-11-12 DIAGNOSIS — Z01419 Encounter for gynecological examination (general) (routine) without abnormal findings: Secondary | ICD-10-CM | POA: Diagnosis present

## 2022-11-17 LAB — CYTOLOGY - PAP
Comment: NEGATIVE
Diagnosis: NEGATIVE
High risk HPV: NEGATIVE

## 2023-12-13 ENCOUNTER — Inpatient Hospital Stay (HOSPITAL_COMMUNITY)
Admission: AD | Admit: 2023-12-13 | Discharge: 2023-12-13 | Disposition: A | Discharge status: Home / Self Care | Payer: 59 | Attending: Family Medicine | Admitting: Family Medicine

## 2023-12-13 ENCOUNTER — Inpatient Hospital Stay (HOSPITAL_COMMUNITY): Payer: 59

## 2023-12-13 ENCOUNTER — Encounter (HOSPITAL_COMMUNITY): Payer: Self-pay

## 2023-12-13 DIAGNOSIS — O26851 Spotting complicating pregnancy, first trimester: Secondary | ICD-10-CM | POA: Diagnosis not present

## 2023-12-13 DIAGNOSIS — O200 Threatened abortion: Secondary | ICD-10-CM | POA: Diagnosis not present

## 2023-12-13 DIAGNOSIS — B9689 Other specified bacterial agents as the cause of diseases classified elsewhere: Secondary | ICD-10-CM | POA: Diagnosis not present

## 2023-12-13 DIAGNOSIS — O23591 Infection of other part of genital tract in pregnancy, first trimester: Secondary | ICD-10-CM | POA: Diagnosis not present

## 2023-12-13 DIAGNOSIS — O208 Other hemorrhage in early pregnancy: Secondary | ICD-10-CM | POA: Insufficient documentation

## 2023-12-13 DIAGNOSIS — Z3A08 8 weeks gestation of pregnancy: Secondary | ICD-10-CM | POA: Insufficient documentation

## 2023-12-13 DIAGNOSIS — O418X10 Other specified disorders of amniotic fluid and membranes, first trimester, not applicable or unspecified: Secondary | ICD-10-CM

## 2023-12-13 DIAGNOSIS — Z3490 Encounter for supervision of normal pregnancy, unspecified, unspecified trimester: Secondary | ICD-10-CM

## 2023-12-13 LAB — CBC
HCT: 37.6 % (ref 36.0–46.0)
Hemoglobin: 13.3 g/dL (ref 12.0–15.0)
MCH: 30.2 pg (ref 26.0–34.0)
MCHC: 35.4 g/dL (ref 30.0–36.0)
MCV: 85.3 fL (ref 80.0–100.0)
Platelets: 302 10*3/uL (ref 150–400)
RBC: 4.41 MIL/uL (ref 3.87–5.11)
RDW: 12 % (ref 11.5–15.5)
WBC: 8.3 10*3/uL (ref 4.0–10.5)
nRBC: 0 % (ref 0.0–0.2)

## 2023-12-13 LAB — URINALYSIS, ROUTINE W REFLEX MICROSCOPIC
Bacteria, UA: NONE SEEN
Bilirubin Urine: NEGATIVE
Glucose, UA: NEGATIVE mg/dL
Hgb urine dipstick: NEGATIVE
Ketones, ur: NEGATIVE mg/dL
Nitrite: NEGATIVE
Protein, ur: NEGATIVE mg/dL
Specific Gravity, Urine: 1.01 (ref 1.005–1.030)
pH: 8 (ref 5.0–8.0)

## 2023-12-13 LAB — WET PREP, GENITAL
Sperm: NONE SEEN
Trich, Wet Prep: NONE SEEN
WBC, Wet Prep HPF POC: >=10 — AB (ref <10)
Yeast Wet Prep HPF POC: NONE SEEN

## 2023-12-13 LAB — HIV ANTIBODY (ROUTINE TESTING W REFLEX): HIV Screen 4th Generation wRfx: NONREACTIVE

## 2023-12-13 LAB — HCG, QUANTITATIVE, PREGNANCY: hCG, Beta Chain, Quant, S: 174461 m[IU]/mL — ABNORMAL HIGH (ref <5)

## 2023-12-13 MED ORDER — PROCHLORPERAZINE MALEATE 10 MG PO TABS: 10.0000 mg | ORAL_TABLET | Freq: Four times a day (QID) | ORAL | 0 refills | Status: AC | PRN

## 2023-12-13 MED ORDER — ONDANSETRON 4 MG PO TBDP: 4.0000 mg | ORAL_TABLET | Freq: Three times a day (TID) | ORAL | 0 refills | Status: AC | PRN

## 2023-12-13 MED ORDER — METRONIDAZOLE 500 MG PO TABS: 500.0000 mg | ORAL_TABLET | Freq: Two times a day (BID) | ORAL | 0 refills | Status: AC

## 2023-12-13 NOTE — MAU Note (Addendum)
.  Chrystine Gaige is a 32 y.o. at [redacted]w[redacted]d here in MAU reporting: Sent over for ectopic work up from [facility]. Patient had positive pregnancy test in office. She reports occasional episodes of VB since 12/8. She reports very light spotting from 12/8-12/10, spotting 12/16, and spotting after intercourse on 12/29 and she reports she passed one small blood clot. She reports two days ago she felt light headed and dizzy after being hungry and not drinking fluids. She reports she does not like the taste of water now. Denies pain or current vaginal bleeding.  Patient very anxious.  LMP: 10/13/2023 - regular periods Onset of complaint: 12/8 Pain score: Denies pain  Vitals:   12/13/23 1533  BP: 123/75  Pulse: 88  Resp: 16  Temp: 98.5 F (36.9 C)  SpO2: 100%     FHT: n/a Lab orders placed from triage: UA

## 2023-12-13 NOTE — MAU Provider Note (Signed)
 History     CSN: [id number]  Arrival date and time: 12/13/23 1519   Event Date/Time   First Provider Initiated Contact with Patient 12/13/23 1559      Chief Complaint  Patient presents with   Vaginal Bleeding   Dizziness   HPI Donna Underwood is a 32 y.o. year old G1P0 female at [redacted]w[redacted]d weeks gestation who was sent to MAU from her OB office reporting (+) UPT there in the setting of spotting off & on since 11/20/2023. She reports her spotting started 12/8/-12/10, stopped then started again on 11/28/2023, stopped again, then started back again after SI on 12/11/2023. She passed small blood clots on 12/11/2023. She reports that 2 days ago she became dizzy and lightheaded after being hungry and not drinking fluids. She says she can't drink water, because it just doesn't taste the same. She denies any VB at this time.  OB History     Gravida  1   Para      Term      Preterm      AB      Living         SAB      IAB      Ectopic      Multiple      Live Births              Past Medical History:  Diagnosis Date   Asthma    since childhood, exercise induced now    Past Surgical History:  Procedure Laterality Date   BREAST REDUCTION SURGERY Bilateral 05/05/2022   Procedure: BREAST REDUCTION WITH LIPOSUCTION;  Surgeon: Lowery Estefana RAMAN, DO;  Location: [facility];  Service: Plastics;  Laterality: Bilateral;   MYRINGOTOMY  2002    Family History  Problem Relation Age of Onset   Hypertension Mother    Eczema Sister     Social History   Tobacco Use   Smoking status: Never   Smokeless tobacco: Never  Vaping Use   Vaping status: Never Used  Substance Use Topics   Alcohol use: Not Currently    Alcohol/week: 1.0 standard drink of alcohol    Types: 1 drink(s) per week    Comment: rare   Drug use: No    Allergies:  Allergies  Allergen Reactions   Bactrim [Sulfamethoxazole-Trimethoprim] Hives   Ceclor [Cefaclor]     Does not know.  Had reaction when she was child.    Medications Prior to Admission  Medication Sig Dispense Refill Last Dose/Taking   albuterol  (PROVENTIL  HFA;VENTOLIN  HFA) 108 (90 BASE) MCG/ACT inhaler Inhale 2 puffs into the lungs every 6 (six) hours as needed for wheezing. 1 Inhaler 11    fluticasone (FLONASE) 50 MCG/ACT nasal spray 1-2 sprays in each nostril      loratadine  (CLARITIN ) 10 MG tablet Take 1 tablet (10 mg total) by mouth daily. 30 tablet 11    omeprazole  (PRILOSEC) 40 MG capsule Take 1 capsule (40 mg total) by mouth daily. 30 capsule 0     Review of Systems  Constitutional:  Positive for appetite change.  HENT: Negative.    Eyes: Negative.   Respiratory: Negative.    Cardiovascular: Negative.   Gastrointestinal: Negative.   Endocrine: Negative.   Genitourinary: Negative.   Musculoskeletal: Negative.   Skin: Negative.   Allergic/Immunologic: Negative.   Neurological:  Positive for dizziness and light-headedness.  Hematological: Negative.   Psychiatric/Behavioral: Negative.     Physical Exam   Blood pressure 123/75,  pulse 88, temperature 98.5 F (36.9 C), temperature source Oral, resp. rate 16, height 5' 2 (1.575 m), weight 73.3 kg, last menstrual period 10/13/2023, SpO2 100%.  Physical Exam Vitals and nursing note reviewed.  Constitutional:      Appearance: Normal appearance. She is normal weight.  Cardiovascular:     Rate and Rhythm: Normal rate.  Pulmonary:     Effort: Pulmonary effort is normal.  Abdominal:     Palpations: Abdomen is soft.  Genitourinary:    Comments: Swabs collected by patient using blind swab technique  Musculoskeletal:        General: Normal range of motion.  Skin:    General: Skin is warm and dry.  Neurological:     Mental Status: She is alert and oriented to person, place, and time.  Psychiatric:        Attention and Perception: Attention and perception normal.        Mood and Affect: Mood is anxious. Affect is tearful.        Speech:  Speech normal.        Behavior: Behavior normal. Behavior is cooperative.        Thought Content: Thought content normal.        Cognition and Memory: Cognition and memory normal.        Judgment: Judgment normal.    MAU Course  Procedures  MDM CCUA UPT CBC ABO/Rh HCG Wet Prep GC/CT -- Results pending  RPR -- Results pending  OB U/S < 14 wks TVUS  Results for orders placed or performed during the hospital encounter of 12/13/23 (from the past 24 hours)  Urinalysis, Routine w reflex microscopic -Urine, Clean Catch     Status: Abnormal   Collection Time: 12/13/23  3:28 PM  Result Value Ref Range   Color, Urine STRAW (A) YELLOW   APPearance CLEAR CLEAR   Specific Gravity, Urine 1.010 1.005 - 1.030   pH 8.0 5.0 - 8.0   Glucose, UA NEGATIVE NEGATIVE mg/dL   Hgb urine dipstick NEGATIVE NEGATIVE   Bilirubin Urine NEGATIVE NEGATIVE   Ketones, ur NEGATIVE NEGATIVE mg/dL   Protein, ur NEGATIVE NEGATIVE mg/dL   Nitrite NEGATIVE NEGATIVE   Leukocytes,Ua SMALL (A) NEGATIVE   RBC / HPF 0-5 0 - 5 RBC/hpf   WBC, UA 0-5 0 - 5 WBC/hpf   Bacteria, UA NONE SEEN NONE SEEN   Squamous Epithelial / HPF 0-5 0 - 5 /HPF   Mucus PRESENT   Wet prep, genital     Status: Abnormal   Collection Time: 12/13/23  3:58 PM   Specimen: Vaginal  Result Value Ref Range   Yeast Wet Prep HPF POC NONE SEEN NONE SEEN   Trich, Wet Prep NONE SEEN NONE SEEN   Clue Cells Wet Prep HPF POC PRESENT (A) NONE SEEN   WBC, Wet Prep HPF POC >=10 (A) <10   Sperm NONE SEEN   ABO/Rh     Status: None   Collection Time: 12/13/23  4:19 PM  Result Value Ref Range   ABO/RH(D)      A POS Performed at [facility], [address] [place], [place] [address]   CBC     Status: None   Collection Time: 12/13/23  4:20 PM  Result Value Ref Range   WBC 8.3 4.0 - 10.5 K/uL   RBC 4.41 3.87 - 5.11 MIL/uL   Hemoglobin 13.3 12.0 - 15.0 g/dL   HCT 62.3 63.9 - 53.9 %   MCV  85.3 80.0 - 100.0 fL   MCH 30.2 26.0 - 34.0 pg   MCHC  35.4 30.0 - 36.0 g/dL   RDW 87.9 88.4 - 84.4 %   Platelets 302 150 - 400 K/uL   nRBC 0.0 0.0 - 0.2 %  hCG, quantitative, pregnancy     Status: Abnormal   Collection Time: 12/13/23  4:20 PM  Result Value Ref Range   hCG, Beta Chain, Quant, S 174,461 (H) <5 mIU/mL  HIV Antibody (routine testing w rflx)     Status: None   Collection Time: 12/13/23  4:20 PM  Result Value Ref Range   HIV Screen 4th Generation wRfx Non Reactive Non Reactive    US  OB Comp Less 14 Wks Result Date: 12/13/2023 CLINICAL DATA:  Vaginal bleeding. EXAM: OBSTETRIC <14 WK ULTRASOUND TECHNIQUE: Transabdominal ultrasound was performed for evaluation of the gestation as well as the maternal uterus and adnexal regions. COMPARISON:  None Available. FINDINGS: Intrauterine gestational sac: Single Yolk sac:  Yes Embryo:  Yes Cardiac Activity: Yes Heart Rate: 158 bpm CRL:   18.4 mm   8 w 2 d                  US  EDC: 07/22/2024 Subchorionic hemorrhage:  Small measuring 1.02 x 0.36 cm. Maternal uterus/adnexae: Right ovary: Normal Left ovary: Not visualized Other :None Free fluid:  None IMPRESSION: 1. Single living intrauterine gestation with estimated gestational age of [redacted] weeks and 2 days. The fetal heart rate is equal to 158 beats per minute 2. Small subchorionic hematoma. Electronically Signed   By: Waddell Calk M.D.   On: 12/13/2023 18:10      Assessment and Plan  1. Spotting affecting pregnancy in first trimester (Primary) -Advised spotting more than likely coming from subchorionic hematoma  2. Subchorionic hematoma in first trimester, single or unspecified fetus -Information provided on subchorionic hematoma  3. Threatened miscarriage in early pregnancy -Information provided on threatened miscarriage  4. Intrauterine pregnancy - Information provided on starting prenatal care -Advised to do date 07/22/2024  5. Bacterial vaginosis - Information provided on bacterial vaginosis - Prescription sent for Flagyl  500 mg p.o.  twice daily x 7 days  6. [redacted] weeks gestation of pregnancy   - Discharge patient - Advised to call [facility] on Thursday to schedule start prenatal care -Patient verbalized understanding and agrees with plan of care   Ala Cart, CNM 12/13/2023, 6:23 PM

## 2023-12-14 LAB — ABO/RH: ABO/RH(D): A POS

## 2023-12-15 LAB — GC/CHLAMYDIA PROBE AMP (~~LOC~~) NOT AT ARMC
Chlamydia: NEGATIVE
Comment: NEGATIVE
Comment: NORMAL
Neisseria Gonorrhea: NEGATIVE
